# Patient Record
Sex: Male | Born: 1940 | Race: White | Hispanic: No | Marital: Married | Smoking: Former smoker
Health system: Southern US, Community
[De-identification: ages and names within clinical notes are randomized; demographics above are authoritative.]

## PROBLEM LIST (undated history)

## (undated) DIAGNOSIS — I4892 Unspecified atrial flutter: Principal | ICD-10-CM

## (undated) DIAGNOSIS — J449 Chronic obstructive pulmonary disease, unspecified: Secondary | ICD-10-CM

## (undated) DIAGNOSIS — E119 Type 2 diabetes mellitus without complications: Secondary | ICD-10-CM

## (undated) DIAGNOSIS — E669 Obesity, unspecified: Secondary | ICD-10-CM

## (undated) DIAGNOSIS — E7800 Pure hypercholesterolemia, unspecified: Secondary | ICD-10-CM

## (undated) DIAGNOSIS — M3390 Dermatopolymyositis, unspecified, organ involvement unspecified: Secondary | ICD-10-CM

## (undated) DIAGNOSIS — M3313 Other dermatomyositis without myopathy: Secondary | ICD-10-CM

## (undated) DIAGNOSIS — I2510 Atherosclerotic heart disease of native coronary artery without angina pectoris: Secondary | ICD-10-CM

## (undated) HISTORY — DX: Chronic obstructive pulmonary disease, unspecified: J44.9

## (undated) HISTORY — DX: Atherosclerotic heart disease of native coronary artery without angina pectoris: I25.10

## (undated) HISTORY — DX: Pure hypercholesterolemia, unspecified: E78.00

## (undated) HISTORY — DX: Dermatopolymyositis, unspecified, organ involvement unspecified: M33.90

## (undated) HISTORY — DX: Obesity, unspecified: E66.9

## (undated) HISTORY — DX: Other dermatomyositis without myopathy: M33.13

## (undated) HISTORY — PX: OTHER SURGICAL HISTORY: SHX169

## (undated) HISTORY — DX: Unspecified atrial flutter: I48.92

---

## 2003-08-13 HISTORY — PX: PLANTAR FASCIECTOMY: SUR600

## 2004-10-10 ENCOUNTER — Ambulatory Visit: Payer: Self-pay | Admitting: Cardiology

## 2004-10-17 ENCOUNTER — Ambulatory Visit: Payer: Self-pay | Admitting: Cardiology

## 2004-10-17 ENCOUNTER — Ambulatory Visit (HOSPITAL_COMMUNITY): Admission: RE | Admit: 2004-10-17 | Discharge: 2004-10-18 | Payer: Self-pay | Admitting: Orthopedic Surgery

## 2004-10-30 ENCOUNTER — Ambulatory Visit: Payer: Self-pay | Admitting: Internal Medicine

## 2004-11-09 ENCOUNTER — Ambulatory Visit: Payer: Self-pay

## 2005-01-08 ENCOUNTER — Ambulatory Visit: Payer: Self-pay | Admitting: Cardiology

## 2005-07-11 ENCOUNTER — Ambulatory Visit: Payer: Self-pay | Admitting: Cardiology

## 2005-07-29 ENCOUNTER — Ambulatory Visit: Payer: Self-pay | Admitting: Gastroenterology

## 2005-12-10 ENCOUNTER — Ambulatory Visit: Payer: Self-pay | Admitting: Cardiology

## 2006-02-17 ENCOUNTER — Ambulatory Visit: Payer: Self-pay | Admitting: Family Medicine

## 2007-01-19 DIAGNOSIS — C4491 Basal cell carcinoma of skin, unspecified: Secondary | ICD-10-CM

## 2007-01-19 DIAGNOSIS — L570 Actinic keratosis: Secondary | ICD-10-CM

## 2007-01-19 HISTORY — DX: Basal cell carcinoma of skin, unspecified: C44.91

## 2007-01-19 HISTORY — DX: Actinic keratosis: L57.0

## 2007-03-26 ENCOUNTER — Ambulatory Visit: Payer: Self-pay | Admitting: Cardiology

## 2007-04-07 ENCOUNTER — Encounter: Payer: Self-pay | Admitting: Cardiology

## 2007-04-07 ENCOUNTER — Ambulatory Visit: Payer: Self-pay | Admitting: Cardiology

## 2007-12-24 ENCOUNTER — Ambulatory Visit: Payer: Self-pay | Admitting: Cardiology

## 2008-09-07 ENCOUNTER — Ambulatory Visit: Payer: Self-pay | Admitting: Cardiology

## 2008-09-19 DIAGNOSIS — C4492 Squamous cell carcinoma of skin, unspecified: Secondary | ICD-10-CM

## 2008-09-19 HISTORY — DX: Squamous cell carcinoma of skin, unspecified: C44.92

## 2009-07-31 DIAGNOSIS — E7800 Pure hypercholesterolemia, unspecified: Secondary | ICD-10-CM

## 2009-07-31 DIAGNOSIS — E119 Type 2 diabetes mellitus without complications: Secondary | ICD-10-CM | POA: Insufficient documentation

## 2009-07-31 DIAGNOSIS — I2510 Atherosclerotic heart disease of native coronary artery without angina pectoris: Secondary | ICD-10-CM

## 2009-07-31 DIAGNOSIS — J449 Chronic obstructive pulmonary disease, unspecified: Secondary | ICD-10-CM

## 2009-08-09 ENCOUNTER — Ambulatory Visit: Payer: Self-pay | Admitting: Cardiology

## 2009-08-14 ENCOUNTER — Ambulatory Visit: Payer: Self-pay | Admitting: Cardiology

## 2009-08-18 LAB — CONVERTED CEMR LAB
ALT: 21 units/L (ref 0–53)
AST: 21 units/L (ref 0–37)
Albumin: 3.7 g/dL (ref 3.5–5.2)
Alkaline Phosphatase: 30 units/L — ABNORMAL LOW (ref 39–117)
HDL: 50.9 mg/dL (ref >39.00)
Total CHOL/HDL Ratio: 3
Triglycerides: 140 mg/dL (ref 0.0–149.0)

## 2010-08-31 ENCOUNTER — Ambulatory Visit
Admission: RE | Admit: 2010-08-31 | Discharge: 2010-08-31 | Payer: Self-pay | Source: Home / Self Care | Attending: Cardiology | Admitting: Cardiology

## 2010-08-31 ENCOUNTER — Encounter: Payer: Self-pay | Admitting: Cardiology

## 2010-09-13 NOTE — Assessment & Plan Note (Signed)
Summary: [facility]   Visit Type:  1 year follow up Primary Provider:  Randa Lynn  CC:  No complaints.  History of Present Illness: Mr Jackson Parks is seen in followup.  We had a long discussion today regarding his DAPT.  I reviewed all of the elements with him, and he felt more comfortable, despite all of the discussion, staying on DAPT.  He is getting along well.  Denies any major complaints.   Problems Prior to Update: 1)  Cad  (ICD-414.00) 2)  Hypercholesterolemia  (ICD-272.0) 3)  Dm  (ICD-250.00) 4)  Obesity, Morbid  (ICD-278.01) 5)  COPD  (ICD-496)  Current Medications (verified): 1)  Plavix 75 Mg Tabs (Clopidogrel Bisulfate) .... Take One Tablet By Mouth Daily 2)  Janumet 50-1000 Mg Tabs (Sitagliptin-Metformin Hcl) .... Take 1 Tablet By Mouth Two Times A Day 3)  Fish Oil 1200 Mg Caps (Omega-3 Fatty Acids) .... Take 1 Capsule By Mouth Two Times A Day 4)  Cinnamon 500 Mg Caps (Cinnamon) .... Take 1 Capsule By Mouth Once A Day 5)  Aspirin 81 Mg Tbec (Aspirin) .... Take One Tablet By Mouth Daily 6)  Lisinopril 20 Mg Tabs (Lisinopril) .... Take One Tablet By Mouth Daily 7)  Lovastatin 40 Mg Tabs (Lovastatin) .... Take One Tablet By Mouth Daily At Bedtime 8)  Metoprolol Tartrate 25 Mg Tabs (Metoprolol Tartrate) .... Take One Tablet By Mouth Daily 9)  Ibuprofen 800 Mg Tabs (Ibuprofen) .... As Needed 10)  Glucosamine 1500 Complex  Caps (Glucosamine-Chondroit-Vit C-Mn) .... Take 1 Capsule By Mouth Once A Day  Allergies (verified): No Known Drug Allergies  Past History:  Past Medical History: Last updated: 12-Aug-2009 Current Problems:  CAD (ICD-414.00) HYPERCHOLESTEROLEMIA (ICD-272.0) DM (ICD-250.00) OBESITY, MORBID (ICD-278.01) COPD (ICD-496)  Past Surgical History: Last updated: 12-Aug-2009  stenting of the  diagonal branch using a 2.5 x 23 Cypher drug-eluting stent followed   by post dilatation.  Right plantar fasciectomy- 2005 Multiple skin lesions removed  Family  History: Last updated: 2009/08/12 Father deceased at age 18 Myocardial infarction Mother deceased age 33 CHF Brothers: Alive 63- CAD- bladder Cancer                 Alive 75- PVD- legs. Sisters: Alive 73 RA, on coumadin  Hx of blood clots x 2  Social History: Last updated: August 12, 2009 Tobacco Use: no. Quit 2002 Drug use: No Alcohol use: 2 margaritas per year Married x 2 - 3 daughters Local truck driver  Vital Signs:  Patient profile:   70 year old male Height:      70 inches Weight:      293.25 pounds BMI:     42.23 Pulse rate:   68 / minute Pulse rhythm:   regular Resp:     18 per minute BP sitting:   112 / 70  (left arm) Cuff size:   large  Vitals Entered By: Vikki Ports (August 31, 2010 12:01 PM)  Physical Exam  General:  Well developed, well nourished, in no acute distress. Head:  normocephalic and atraumatic Eyes:  PERRLA/EOM intact; conjunctiva and lids normal. Lungs:  Clear bilaterally to auscultation and percussion. Heart:  PMI non displaced.  Normal S1 and S2.  No murmur or rub.  Abdomen:  Bowel sounds positive; abdomen soft and non-tender without masses, organomegaly, or hernias noted. No hepatosplenomegaly. Pulses:  pulses normal in all 4 extremities Extremities:  No clubbing or cyanosis. Neurologic:  Alert and oriented x 3.   EKG  Procedure date:  08/31/2010  Findings:      NSR. RBBB.  Impression & Recommendations:  Problem # 1:  CAD (ICD-414.00) stable at present on DAPT.  Wants to continue. His updated medication list for this problem includes:    Plavix 75 Mg Tabs (Clopidogrel bisulfate) .Marland Kitchen... Take one tablet by mouth daily    Aspirin 81 Mg Tbec (Aspirin) .Marland Kitchen... Take one tablet by mouth daily    Lisinopril 20 Mg Tabs (Lisinopril) .Marland Kitchen... Take one tablet by mouth daily    Metoprolol Tartrate 25 Mg Tabs (Metoprolol tartrate) .Marland Kitchen... Take one tablet by mouth daily  Orders: EKG w/ Interpretation (93000)  Problem # 2:  HYPERCHOLESTEROLEMIA  (ICD-272.0) should have lipid and liver. His updated medication list for this problem includes:    Lovastatin 40 Mg Tabs (Lovastatin) .Marland Kitchen... Take one tablet by mouth daily at bedtime  Orders: EKG w/ Interpretation (93000)  Problem # 3:  DM (ICD-250.00) followed by Dr. Randa Lynn. His updated medication list for this problem includes:    Janumet 50-1000 Mg Tabs (Sitagliptin-metformin hcl) .Marland Kitchen... Take 1 tablet by mouth two times a day    Aspirin 81 Mg Tbec (Aspirin) .Marland Kitchen... Take one tablet by mouth daily    Lisinopril 20 Mg Tabs (Lisinopril) .Marland Kitchen... Take one tablet by mouth daily  Patient Instructions: 1)  Your physician recommends that you continue on your current medications as directed. Please refer to the Current Medication list given to you today. 2)  Your physician wants you to follow-up in: 1 YEAR.  You will receive a reminder letter in the mail two months in advance. If you don't receive a letter, please call our office to schedule the follow-up appointment.

## 2010-11-21 ENCOUNTER — Telehealth: Payer: Self-pay | Admitting: Cardiology

## 2010-11-21 NOTE — Telephone Encounter (Signed)
I reviewed this note with Dr Riley Kill and he said the pt was okay to hold Plavix but to remain on ASA for procedure.  I spoke with the pt's wife and made her aware of Dr Rosalyn Charters recommendation.

## 2010-11-21 NOTE — Telephone Encounter (Signed)
I spoke with the pt's wife and the pt is scheduled for an IPP (Urology procedure) at [facility] on 11/26/10. The pt took his last dose of Plavix on Monday night but has continued Aspirin.  They can do procedure while the pt is on ASA but would like to know if it could also be stopped.  The pt's wife said that Dr Riley Kill made it clear at his most recent OV if the pt stopped Plavix he had to remain on ASA.  I will send this message to Dr Riley Kill for further review.

## 2010-11-25 ENCOUNTER — Other Ambulatory Visit: Payer: Self-pay | Admitting: Cardiology

## 2010-12-25 NOTE — Assessment & Plan Note (Signed)
[facility]                            CARDIOLOGY OFFICE NOTE   NAME:Jackson Parks, Jackson Parks                     MRN:          [id number]  DATE:03/26/2007                            DOB:          [birth year]    HISTORY OF PRESENT ILLNESS:  Jackson Parks is in for follow up.  He was  last seen in May 2007.  In general, he is doing extremely well.  He  denies any ongoing chest pain or shortness of breath.  He has had  traumatic improvement of his symptoms from placement of a drug eluting  stent in the diagonal territory.  The patient is diabetic.  He has  remained on Plavix and aspirin, has dual antiplatelet therapy, and has  had virtually no problems with this.  He is followed up with his primary  care doctor for both management of his diabetes as well as management of  his hyperlipidemia.  He has had no major problems.   CURRENT MEDICATIONS:  1. Plavix 75 mg daily.  2. Toprol 25 mg daily.  3. Enteric coated aspirin 81 mg daily.  4. Glucosamine/Janumet 550/1000 daily.  5. Omega-3 fish oil 2400 mg daily.  6. Cinnamon 500 mg daily.  7. Aspirin 81 mg daily.  8. Lovastatin 20 mg q.h.s.  9. Lisinopril.   PHYSICAL EXAMINATION:  VITAL SIGNS:  Weight 303 pounds, blood pressure  114/62, pulse 80.  LUNGS:  Fields are clear.  There is a soft systolic ejection murmur with  question early diastolic blow, although, this is very, very soft.  It  was only a 1-2/6 soft murmur.  There was no definite extremity edema.   STUDIES:  The patient electrocardiogram demonstrates normal sinus  rhythm.  There is a right bundle branch block.  He had an RV conduction  delay noted on previous EKG.  This is not felt to be significant.   IMPRESSION:  1. Coronary artery disease status post percutaneous stenting of the      diagonal.  2. Hypercholesterolemia on lipid lower management.  3. Diabetes mellitus.   RECOMMENDATIONS:  1. Continue to follow up with Dr. Micah Noel.  2. He and I  discussed the latest data on dual antiplatelet therapy and      discussion is going on in the background at the present time.     Arturo Morton. Riley Kill, MD, [facility]  Electronically Signed    TDS/MedQ  DD: 03/26/2007  DT: 03/27/2007  Job #: [id number]   cc:   [facility], [facility] Dr. Lorin Picket

## 2010-12-25 NOTE — Assessment & Plan Note (Signed)
[facility]                            CARDIOLOGY OFFICE NOTE   NAME:Jackson Parks, Jackson Parks                     MRN:          [id number]  DATE:09/07/2008                            DOB:          1941/03/12    Jackson Parks is in for followup.  He had recently seen Dr. Micah Noel and had  a lesion excised from his left arm.  It has been sent for pathology and  the patient is not aware of the results yet.  He is fortunately  continued to lose some weight.  He is down from 315 to 301.  He has been  making conscious effort to do so.  He denies any progressive symptoms.  His wife is worried about him stopping Plavix, specifically as related  to her mother.   MEDICATIONS:  1. Plavix 75 mg daily.  2. Glucosamine daily.  3. Janumet 50/1000 daily.  4. Omega-3 fish oil 2400 mg a day.  5. Cinnamon 500 mg daily.  6. Aspirin 81 mg daily.  7. Lisinopril 20 mg daily.  8. Lovastatin 40 mg daily.  9. Metoprolol tartrate 25 mg b.i.d.   PHYSICAL EXAMINATION:  GENERAL:  He is alert and oriented in no  distress.  VITAL SIGNS:  Weight is 300 pounds, blood pressure 110/62, pulse 75.  LUNG:  Fields are clear.  CARDIAC:  Rhythm is regular.   There is Band-Aid over the left arm.   Electrocardiogram demonstrates normal sinus rhythm with right bundle  branch block.  Compared to previous tracings of May 2009, no significant  interval changes noted.   IMPRESSION:  1. Coronary artery disease status post percutaneous stenting of the      diagonal branch using a 2.5 x 23 Cypher drug-eluting stent followed      by post dilatation.  2. Well-preserved left ventricular function with mild aortic valve      abnormality, not obvious by examinations.  3. Hypercholesterolemia, on lipid lowering therapy.  4. Non-insulin-dependent diabetes mellitus.  5. Morbid obesity with a recent weight loss.   PLAN:  1. Encourage continued weight loss.  2. At the present time, they would like to  continue on dual      antiplatelet therapy.  According to      the guidelines, this could be stopped.  However, given the      uncertainty, the patient would like to continue.     Arturo Morton. Riley Kill, MD, [facility]  Electronically Signed    TDS/MedQ  DD: 09/07/2008  DT: 09/08/2008  Job #: [id number]

## 2010-12-25 NOTE — Assessment & Plan Note (Signed)
[facility]                            CARDIOLOGY OFFICE NOTE   NAME:Jackson Parks                     MRN:          [id number]  DATE:12/24/2007                            DOB:          1941/02/05    [name] is in for a followup visit.  Since I last saw him, he has  been getting along well, but unfortunately he has continued to gain some  weight.  He denies any chest pain.  He underwent a 2-D echocardiogram in  August 2008. His LV function was normal with an estimated ejection  fraction of 55-60%.  There were no definite regional wall motion  abnormalities.  There was mild calcification of aortic valve and mild  annular calcification with mild left atrial dilatation.   CURRENT MEDICATIONS:  1. Plavix 75 mg daily.  2. Toprol 25 mg daily.  3. Glucosamine chondroitin daily.  4. Janumet 500, 1000 mg daily.  5. Omega 3 fish oil 240 mg daily.  6. Cinnamon 500 mg daily.  7. Aspirin 81 mg daily.  8. Lovastatin 20 mg nightly.  9. Lisinopril 20 mg daily.   PHYSICAL EXAMINATION:  GENERAL:  He is alert and oriented.  VITAL SIGNS:  The weight is 315 pounds.  Blood pressure is 128/68. The  pulse is 74.  SKIN:  The patient has clearly been out in the sun and has fair  complexion.  LUNGS:  The lung fields are clear.  CARDIAC:  The cardiac rhythm is regular. There is only a 1/6 systolic  ejection murmur.   IMPRESSION:  1. Coronary artery disease status post percutaneous stenting of the      diagonal branch using a 2.5 x 23 Cypher drug-eluting stent followed      by post dilatation.  2. Well-preserved overall left ventricular function with mild aortic      valve abnormality.  3. Hypercholesterolemia on lipid-lowering therapy.  4. Non-insulin-dependent diabetes mellitus.  5. Morbid obesity.   RECOMMENDATIONS:  1. Return to clinic in 1 year.  2. At the present time, we have had a thorough discussion about the      dual antiplatelet therapy.   Based upon the current national      discussion, we will recommend continued treatment.  We have      discussed the possibility that he could come off since he is out      nearly 3 years now,      but he has tolerated this without difficulty.  3. Continued followup in Dr. Nani Parks office for lipid profile.     Arturo Morton. Riley Kill, MD, [facility]  Electronically Signed    TDS/MedQ  DD: 12/27/2007  DT: 12/27/2007  Job #: [id number]

## 2010-12-28 NOTE — Cardiovascular Report (Signed)
NAME:  Jackson Parks, MANIACI NO.:  [id number]   MEDICAL RECORD NO.:  [id number]          PATIENT TYPE:  OIB   LOCATION:  2899                         FACILITY:  [facility]   PHYSICIAN:  Arturo Morton. Riley Kill, M.D. [facility] OF BIRTH:  03-14-1941   DATE OF PROCEDURE:  10/17/2004  DATE OF DISCHARGE:                              CARDIAC CATHETERIZATION   INDICATIONS:  Mr. Eisenhardt is a delightful 70 year old who has had several  episodes of chest pain.  Myoview revealed an anterior defect.  Because of  this, he was subsequently referred for cardiac catheterization.  The risks,  benefits, and alternatives were discussed with the patient in detail; he was  agreeable to proceed.   PROCEDURE:  1.  Left heart catheterization.  2.  Selective coronary arteriography.  3.  Selective left ventriculography.  4.  Percutaneous transluminal coronary angioplasty and stenting of the left      anterior descending diagonal.   DESCRIPTION OF PROCEDURE:  The patient was brought to the catheterization  laboratory and prepped and draped in the usual fashion.  Through an anterior  puncture, the right femoral artery was easily entered.  A 6-French sheath  was placed and because of the patient's weight, it was difficult to see his  anatomy, but we were able to identify a high-grade subtotal occlusion of the  diagonal branch.  A guiding catheter was subsequently put in to identify  this area better.  The circumflex was a dominant vessel and right coronary  was nondominant.  The diagonal appeared to have a high-grade lesion which  corresponding well to the diagnostic findings at [facility].  With this,  percutaneous intervention was recommended.  We were uncertain whether or not  a 2.5-mm drug-eluting stent would fit.  This was explained to the family  prior to the procedure.  A JL4 guiding catheter was subsequently utilized  with a 7-French sheath after adequate scout views and appropriate  anticoagulation  with heparin and eptifibatide, the lesion was crossed with a  Hi-Torque floppy wire and then dilated with a 2.25 15 Maverick balloon.  Following this, intracoronary nitroglycerin was administered and it appeared  that it would be satisfactory to place a 2.5 x 23 CYPHER stent.  This was  then carefully placed.  This was taken up to 14 atmospheres.  Post-  dilatations were done throughout the balloon using a 2.5 PowerSail also up  to 14 atmospheres.  There is marked improvement the appearance of the artery  with excellent TIMI-3 flow.  The procedure was subsequently completed, all  catheters were subsequently removed and the femoral sheath sewn into place,  and he was taken to the holding area in satisfactory clinical condition.   HEMODYNAMIC DATA.:  1.  Central aortic pressure 150/89, mean 150.  2.  LV 148/20.  3.  No gradient on pullback across aortic valve.   ANGIOGRAPHIC DATA.:  1.  Ventriculography was performed in the RAO projection.  Overall systolic      function was preserved.  2.  The right coronary was a small nondominant vessel that was very tiny in  caliber distally.  3.  The left main was free of critical disease.  4.  The LAD coursed the apex and had some mild luminal irregularity      throughout, but no areas high-grade stenosis in the LAD proper; however,      the large diagonal branch had a 95% stenosis overlapping the origins of      a couple small side branches.  Distal to this, the vessel divided into 2      smaller branches.  Following balloon dilatation, the vessel appeared to      be at least 2.5 and subsequently a 2.5-mm CYPHER 23 drug-eluting stent      was placed in this location.  This resulted in excellent angiographic      appearance without evidence of significant compromise.  Runoff into the      distal vessel was excellent.  5.  There was a small ramus intermedius without critical disease.  6.  The circumflex provides a large marginal branch that has  some ostial      calcification and 30% narrowing, followed by a second 30% narrowing.      There is 30% narrowing in the mid-portion of the AV circumflex which      provides 2 posterolateral segments.   CONCLUSION:  1.  Successful percutaneous stenting of the diagonal branch.  2.  Well-preserved left ventricular function.  3.  Mild scattered disease as noted above.   DISPOSITION:  The patient will need aspirin and Plavix for minimum of 3  months and preferably 1 year or more.  1.  Risk factor reduction is recommended.  We will discuss this with him in      detail.      TDS/MEDQ  D:  10/17/2004  T:  10/18/2004  Job:  [id number]   cc:   [facility]

## 2010-12-28 NOTE — Discharge Summary (Signed)
NAME:  Jackson Parks, GOLDSTON NO.:  [id number]   MEDICAL RECORD NO.:  [id number]          PATIENT TYPE:  OIB   LOCATION:  6526                         FACILITY:  [facility]   PHYSICIAN:  Arturo Morton. Riley Kill, M.D. [facility] OF BIRTH:  [birth year]   DATE OF ADMISSION:  10/17/2004  DATE OF DISCHARGE:  10/18/2004                                 DISCHARGE SUMMARY   DISCHARGE DIAGNOSIS:  Status post cardiac catheterization with successful  percutaneous stenting of diagonal branch using a Cypher stent.   PAST MEDICAL HISTORY:  1.  Obesity.  2.  History of tobacco abuse, currently discontinued.  3.  COPD.  4.  Hyperlipidemia.  5.  Hyperglycemia.   The patient discharged home with Plavix times one year. Discharging  medications include Plavix 75 mg daily, aspirin 325 mg daily, Toprol-XL 25  mg daily, and Mevacor 40 mg daily.   PAIN MANAGEMENT:  Tylenol for general discomfort, nitroglycerin for chest  discomfort.   ACTIVITY:  No driving times two days, no lifting over 10 pounds times one  week.   DIET:  He is to follow a low-fat, low-salt diet.   WOUND CARE:  Gently clean cath site with soap and water. No tub bathing for  one to two weeks.   He is to call our office for any fever, pain or swelling from the cath site.  I have scheduled him for a follow-up appointment with Dr. Rosalyn Charters PA on  October 31, 2003, at 9:15 a.m.   At discharge, Dr. Riley Kill in to see patient. Labwork stable. BUN 9,  creatinine 0.9, glucose elevated at 168, hemoglobin 14.7, hematocrit 42.8.  Patient afebrile. Blood pressure stable. Saturation 94% on room air. He will  follow up with Dr. Riley Kill as scheduled and follow up with Dr. Micah Noel for  primary care issues. In the future if his hyperglycemia continues he will  need interventions with diet, exercise, or oral hypoglycemic medications.      MB/MEDQ  D:  10/18/2004  T:  10/18/2004  Job:  [id number]   cc:   Dr. Darrel Reach, [place]

## 2011-03-27 ENCOUNTER — Other Ambulatory Visit: Payer: Self-pay | Admitting: Cardiology

## 2011-09-26 ENCOUNTER — Other Ambulatory Visit: Payer: Self-pay | Admitting: Cardiology

## 2011-10-25 ENCOUNTER — Encounter: Payer: Self-pay | Admitting: *Deleted

## 2011-11-07 ENCOUNTER — Ambulatory Visit (INDEPENDENT_AMBULATORY_CARE_PROVIDER_SITE_OTHER): Payer: Medicare Other | Admitting: Cardiology

## 2011-11-07 ENCOUNTER — Encounter: Payer: Self-pay | Admitting: Cardiology

## 2011-11-07 VITALS — BP 120/78 | HR 70 | Ht 71.0 in | Wt 296.4 lb

## 2011-11-07 DIAGNOSIS — E7800 Pure hypercholesterolemia, unspecified: Secondary | ICD-10-CM

## 2011-11-07 DIAGNOSIS — E119 Type 2 diabetes mellitus without complications: Secondary | ICD-10-CM

## 2011-11-07 DIAGNOSIS — I2510 Atherosclerotic heart disease of native coronary artery without angina pectoris: Secondary | ICD-10-CM

## 2011-11-07 NOTE — Patient Instructions (Signed)
Your physician wants you to follow-up in: 1 YEAR.  You will receive a reminder letter in the mail two months in advance. If you don't receive a letter, please call our office to schedule the follow-up appointment.  Your physician recommends that you continue on your current medications as directed. Please refer to the Current Medication list given to you today.  

## 2011-11-16 NOTE — Progress Notes (Signed)
HPI:  Patient seen in follow up.  He is doing well.  Every year we discuss use of DAPT, and feels more comfortable staying on clopidogrel.  He has a first gen stent in the diagonal position with a stable indication, and he is not having any angina.  No rest symptoms.  Does not use ibuprofen regularly.    Current Outpatient Prescriptions  Medication Sig Dispense Refill  . aspirin EC 81 MG tablet Take 81 mg by mouth daily.      . Cinnamon 500 MG capsule Take 500 mg by mouth daily.      . fish oil-omega-3 fatty acids 1000 MG capsule Take 2 g by mouth daily.      . Glucosamine-Chondroit-Vit C-Mn (GLUCOSAMINE 1500 COMPLEX) CAPS Take by mouth daily.      Marland Kitchen ibuprofen (ADVIL,MOTRIN) 800 MG tablet Take 800 mg by mouth as needed.      Marland Kitchen lisinopril (PRINIVIL,ZESTRIL) 20 MG tablet Take 20 mg by mouth daily.      Marland Kitchen lovastatin (MEVACOR) 40 MG tablet take 1 tablet by mouth at bedtime  30 tablet  1  . metoprolol tartrate (LOPRESSOR) 25 MG tablet Take 25 mg by mouth daily.      Marland Kitchen PLAVIX 75 MG tablet take 1 tablet by mouth once daily  30 tablet  1  . sitaGLIPtan-metformin (JANUMET) 50-1000 MG per tablet Take 1 tablet by mouth 2 (two) times daily with a meal.        No Known Allergies  Past Medical History  Diagnosis Date  . CAD (coronary artery disease)   . Hypercholesterolemia   . DM (dermatomyositis)   . Obesity   . COPD (chronic obstructive pulmonary disease)     Past Surgical History  Procedure Date  . Plantar fasciectomy 2005  . Skin lesions     multiple removed  . Steting     stenting of the diagonal branch using a 2.5 x 23 Cypher drug-eluting stent followed by post dilatation    Family History  Problem Relation Age of Onset  . Heart attack Father 90    deceased  . Heart failure Mother 53    deceased  . Coronary artery disease Brother 58  . Peripheral vascular disease Brother 46    History   Social History  . Marital Status: Married    Spouse Name: N/A    Number of  Children: 2  . Years of Education: N/A   Occupational History  . truck driver    Social History Main Topics  . Smoking status: Former Smoker    Quit date: 08/12/2000  . Smokeless tobacco: Not on file  . Alcohol Use: Yes     two margaritas per year  . Drug Use: Not on file  . Sexually Active: Not on file   Other Topics Concern  . Not on file   Social History Narrative  . No narrative on file    ROS: Please see the HPI.  All other systems reviewed and negative.  PHYSICAL EXAM:  BP 120/78  Pulse 70  Ht 5\' 11"  (1.803 m)  Wt 296 lb 6.4 oz (134.446 kg)  BMI 41.34 kg/m2  General: Well developed, obese, in no acute distress. Head:  Normocephalic and atraumatic. Neck: no JVD Lungs: Clear to auscultation and percussion. Heart: Normal S1 and S2.  No murmur, rubs or gallops.  Abdomen:  Normal bowel sounds; soft; non tender; no organomegaly Pulses: Pulses normal in all 4 extremities. Extremities: No clubbing or  cyanosis. No edema. Neurologic: Alert and oriented x 3.  EKG:  NSR.  RBBB.  No acute changes.    ASSESSMENT AND PLAN:

## 2011-11-29 ENCOUNTER — Other Ambulatory Visit: Payer: Self-pay | Admitting: Cardiology

## 2011-12-12 NOTE — Assessment & Plan Note (Signed)
Monitored by his primary care MD.

## 2011-12-12 NOTE — Assessment & Plan Note (Signed)
Followed by his primary MD.  Leanora Ivanoff lovastatin.  Last LDL measured in our system 99.

## 2011-12-12 NOTE — Assessment & Plan Note (Signed)
Had diagonal stent, and no other critical obstruction. First generation DES.  He prefers to continue DAPT.

## 2012-10-03 ENCOUNTER — Encounter (HOSPITAL_COMMUNITY): Payer: Self-pay | Admitting: Emergency Medicine

## 2012-10-03 ENCOUNTER — Emergency Department (HOSPITAL_COMMUNITY): Payer: Medicare Other

## 2012-10-03 ENCOUNTER — Inpatient Hospital Stay (HOSPITAL_COMMUNITY)
Admission: EM | Admit: 2012-10-03 | Discharge: 2012-10-06 | DRG: 287 | Disposition: A | Discharge status: Home / Self Care | Payer: Medicare Other | Attending: Cardiology | Admitting: Cardiology

## 2012-10-03 DIAGNOSIS — R0789 Other chest pain: Secondary | ICD-10-CM | POA: Diagnosis present

## 2012-10-03 DIAGNOSIS — J4489 Other specified chronic obstructive pulmonary disease: Secondary | ICD-10-CM | POA: Diagnosis present

## 2012-10-03 DIAGNOSIS — E119 Type 2 diabetes mellitus without complications: Secondary | ICD-10-CM | POA: Diagnosis present

## 2012-10-03 DIAGNOSIS — Z7901 Long term (current) use of anticoagulants: Secondary | ICD-10-CM

## 2012-10-03 DIAGNOSIS — Z6841 Body Mass Index (BMI) 40.0 and over, adult: Secondary | ICD-10-CM

## 2012-10-03 DIAGNOSIS — R079 Chest pain, unspecified: Secondary | ICD-10-CM

## 2012-10-03 DIAGNOSIS — I4892 Unspecified atrial flutter: Principal | ICD-10-CM | POA: Diagnosis present

## 2012-10-03 DIAGNOSIS — I2510 Atherosclerotic heart disease of native coronary artery without angina pectoris: Secondary | ICD-10-CM | POA: Diagnosis present

## 2012-10-03 DIAGNOSIS — E7800 Pure hypercholesterolemia, unspecified: Secondary | ICD-10-CM | POA: Diagnosis present

## 2012-10-03 DIAGNOSIS — Z79899 Other long term (current) drug therapy: Secondary | ICD-10-CM

## 2012-10-03 DIAGNOSIS — G4733 Obstructive sleep apnea (adult) (pediatric): Secondary | ICD-10-CM | POA: Diagnosis present

## 2012-10-03 DIAGNOSIS — Z87891 Personal history of nicotine dependence: Secondary | ICD-10-CM

## 2012-10-03 DIAGNOSIS — Z7982 Long term (current) use of aspirin: Secondary | ICD-10-CM

## 2012-10-03 DIAGNOSIS — Z8249 Family history of ischemic heart disease and other diseases of the circulatory system: Secondary | ICD-10-CM

## 2012-10-03 DIAGNOSIS — Z9861 Coronary angioplasty status: Secondary | ICD-10-CM

## 2012-10-03 DIAGNOSIS — E669 Obesity, unspecified: Secondary | ICD-10-CM | POA: Diagnosis present

## 2012-10-03 LAB — CBC
HCT: 40.2 % (ref 39.0–52.0)
Hemoglobin: 14.2 g/dL (ref 13.0–17.0)
MCHC: 35.3 g/dL (ref 30.0–36.0)
RDW: 13.6 % (ref 11.5–15.5)
WBC: 11 10*3/uL — ABNORMAL HIGH (ref 4.0–10.5)

## 2012-10-03 LAB — URINALYSIS, ROUTINE W REFLEX MICROSCOPIC
Glucose, UA: 100 mg/dL — AB
Hgb urine dipstick: NEGATIVE
Leukocytes, UA: NEGATIVE
pH: 5 (ref 5.0–8.0)

## 2012-10-03 LAB — POCT I-STAT TROPONIN I
Troponin i, poc: 0 ng/mL (ref 0.00–0.08)
Troponin i, poc: 0.01 ng/mL (ref 0.00–0.08)

## 2012-10-03 LAB — COMPREHENSIVE METABOLIC PANEL
ALT: 14 U/L (ref 0–53)
Albumin: 3.8 g/dL (ref 3.5–5.2)
Alkaline Phosphatase: 26 U/L — ABNORMAL LOW (ref 39–117)
BUN: 20 mg/dL (ref 6–23)
Chloride: 100 mEq/L (ref 96–112)
Glucose, Bld: 135 mg/dL — ABNORMAL HIGH (ref 70–99)
Potassium: 4.5 mEq/L (ref 3.5–5.1)
Sodium: 135 mEq/L (ref 135–145)
Total Bilirubin: 0.2 mg/dL — ABNORMAL LOW (ref 0.3–1.2)
Total Protein: 7.5 g/dL (ref 6.0–8.3)

## 2012-10-03 LAB — PROTIME-INR: Prothrombin Time: 13.5 seconds (ref 11.6–15.2)

## 2012-10-03 MED ORDER — NITROGLYCERIN 0.4 MG SL SUBL
SUBLINGUAL_TABLET | SUBLINGUAL | Status: AC
  Filled 2012-10-03: qty 25

## 2012-10-03 MED ORDER — IOHEXOL 350 MG/ML SOLN
100.0000 mL | Freq: Once | INTRAVENOUS | Status: AC | PRN
  Administered 2012-10-03: 200 mL via INTRAVENOUS

## 2012-10-03 MED ORDER — ASPIRIN 81 MG PO CHEW
81.0000 mg | CHEWABLE_TABLET | Freq: Once | ORAL | Status: AC
  Administered 2012-10-03: 81 mg via ORAL
  Filled 2012-10-03: qty 1

## 2012-10-03 MED ORDER — METOPROLOL TARTRATE 1 MG/ML IV SOLN
5.0000 mg | Freq: Once | INTRAVENOUS | Status: AC
  Administered 2012-10-03: 5 mg via INTRAVENOUS
  Filled 2012-10-03: qty 5

## 2012-10-03 MED ORDER — METOPROLOL TARTRATE 1 MG/ML IV SOLN
5.0000 mg | Freq: Once | INTRAVENOUS | Status: AC
  Administered 2012-10-03: 5 mg via INTRAVENOUS
  Filled 2012-10-03 (×2): qty 5

## 2012-10-03 MED ORDER — SODIUM CHLORIDE 0.9 % IV SOLN
1000.0000 mL | INTRAVENOUS | Status: DC
  Administered 2012-10-03 – 2012-10-05 (×3): 1000 mL via INTRAVENOUS

## 2012-10-03 NOTE — ED Provider Notes (Signed)
History     CSN: [id number]  Arrival date & time 10/03/12  1813   First MD Initiated Contact with Patient 10/03/12 1831      Chief Complaint  Patient presents with  . Chest Pain    (Consider location/radiation/quality/duration/timing/severity/associated sxs/prior treatment) HPI Comments: Patient tells a rambling story, about how to and better over the rosebud peanuts, steak and potatoes last night. He had helped a friend Midwife a hog this morning, and afterwards ate two sausage patties after that. Then he had gone home and had salsa and chips at lunch time. After that he has noted what off of trailer. He developed chest pain in the anterior chest that went up into his neck. He rested and it went away. He went inside and took a shower, and the pain came back and he had to sit down and rest again. He took 3 baby aspirin was, and decided to come to the hospital. He has a history of coronary artery disease and had a stent placed by Dr. Riley Kill in 2006. He had been on Plavix until he ran out of it about 3 weeks ago.  Patient is a 72 y.o. male presenting with chest pain. The history is provided by the patient and medical records. No language interpreter was used.  Chest Pain Pain location:  Substernal area Pain quality: aching   Pain radiates to:  L jaw Pain radiates to the back: no (He is not currently in pain.)   Pain severity:  No pain Onset quality:  Sudden Duration:  10 minutes Timing:  Intermittent Progression since onset: He has had 2 episodes of the chest pain. Chronicity:  New Context comment:  Exertion. Relieved by:  Rest Worsened by:  Nothing tried Associated symptoms: no dizziness   Risk factors: coronary artery disease, diabetes mellitus, hypertension and male sex   Risk factors comment:  Known coronary artery disease with prior stenting.   Past Medical History  Diagnosis Date  . CAD (coronary artery disease)   . Hypercholesterolemia   . DM (dermatomyositis)   .  Obesity   . COPD (chronic obstructive pulmonary disease)     Past Surgical History  Procedure Laterality Date  . Plantar fasciectomy  2005  . Skin lesions      multiple removed  . Steting      stenting of the diagonal branch using a 2.5 x 23 Cypher drug-eluting stent followed by post dilatation    Family History  Problem Relation Age of Onset  . Heart attack Father 15    deceased  . Heart failure Mother 5    deceased  . Coronary artery disease Brother 73  . Peripheral vascular disease Brother 53    History  Substance Use Topics  . Smoking status: Former Smoker    Quit date: 12/23/2000  . Smokeless tobacco: Not on file  . Alcohol Use: Yes     Comment: two margaritas per year      Review of Systems  Constitutional: Negative.   HENT: Negative.   Eyes: Negative.   Respiratory: Negative.   Cardiovascular: Positive for chest pain.  Gastrointestinal: Negative.   Endocrine:       Known diabetic.  Genitourinary: Negative.   Musculoskeletal: Negative.   Skin: Negative.   Neurological: Negative.  Negative for dizziness.  Psychiatric/Behavioral: Negative.     Allergies  Review of patient's allergies indicates no known allergies.  Home Medications   Current Outpatient Rx  Name  Route  Sig  Dispense  Refill  . aspirin EC 81 MG tablet   Oral   Take 81 mg by mouth daily.         . Cinnamon 500 MG capsule   Oral   Take 500 mg by mouth daily.         . fish oil-omega-3 fatty acids 1000 MG capsule   Oral   Take 2 g by mouth daily.         . Glucosamine-Chondroit-Vit C-Mn (GLUCOSAMINE 1500 COMPLEX) CAPS   Oral   Take by mouth daily.         Marland Kitchen ibuprofen (ADVIL,MOTRIN) 800 MG tablet   Oral   Take 800 mg by mouth as needed.         Marland Kitchen lisinopril (PRINIVIL,ZESTRIL) 20 MG tablet   Oral   Take 20 mg by mouth daily.         Marland Kitchen lovastatin (MEVACOR) 40 MG tablet      take 1 tablet by mouth at bedtime   30 tablet   5   . metoprolol tartrate  (LOPRESSOR) 25 MG tablet   Oral   Take 25 mg by mouth daily.         Marland Kitchen PLAVIX 75 MG tablet      take 1 tablet by mouth once daily   30 tablet   1   . sitaGLIPtan-metformin (JANUMET) 50-1000 MG per tablet   Oral   Take 1 tablet by mouth 2 (two) times daily with a meal.           BP 117/80  Pulse 116  Temp(Src) 97.9 F (36.6 C) (Oral)  Resp 16  SpO2 94%  Physical Exam  Nursing note and vitals reviewed. Constitutional: He is oriented to person, place, and time.  He has a resting tachycardia of about 120. Obese elderly man in no distress.  HENT:  Head: Normocephalic and atraumatic.  Right Ear: External ear normal.  Left Ear: External ear normal.  Mouth/Throat: Oropharynx is clear and moist.  Eyes: Conjunctivae and EOM are normal. Pupils are equal, round, and reactive to light.  Neck: Normal range of motion.  Cardiovascular: Normal rate, regular rhythm and normal heart sounds.   Pulmonary/Chest: Effort normal.  Abdominal: Soft. Bowel sounds are normal.  Musculoskeletal: Normal range of motion.  Neurological: He is alert and oriented to person, place, and time.  No sensory or motor deficit.  Skin: Skin is warm and dry.  Psychiatric: He has a normal mood and affect. His behavior is normal.    ED Course  Procedures (including critical care time)  6:32 PM  Date: 10/03/2012  Rate: 123  Rhythm: sinus tachycardia  QRS Axis: left  Intervals: QT prolonged  ST/T Wave abnormalities: normal  Conduction Disutrbances:right bundle branch block  Narrative Interpretation: Abnormal EKG  Old EKG Reviewed: changes noted--rate more rapid than on tracind o 11/07/2011.  7:13 PM Pt seen --> physical exam performed.  EKG showed a sinus tachycardia, so metoprolol 5 mg IV was ordered.  Lab workup was ordered.  8:58 PM Results for orders placed during the hospital encounter of 10/03/12  CBC      Result Value Range   WBC 11.0 (*) 4.0 - 10.5 K/uL   RBC 4.42  4.22 - 5.81 MIL/uL    Hemoglobin 14.2  13.0 - 17.0 g/dL   HCT 91.4  78.2 - 95.6 %   MCV 91.0  78.0 - 100.0 fL   MCH 32.1  26.0 - 34.0 pg   MCHC  35.3  30.0 - 36.0 g/dL   RDW 14.7  82.9 - 56.2 %   Platelets 222  150 - 400 K/uL  COMPREHENSIVE METABOLIC PANEL      Result Value Range   Sodium 135  135 - 145 mEq/L   Potassium 4.5  3.5 - 5.1 mEq/L   Chloride 100  96 - 112 mEq/L   CO2 24  19 - 32 mEq/L   Glucose, Bld 135 (*) 70 - 99 mg/dL   BUN 20  6 - 23 mg/dL   Creatinine, Ser 1.30  0.50 - 1.35 mg/dL   Calcium 9.8  8.4 - 86.5 mg/dL   Total Protein 7.5  6.0 - 8.3 g/dL   Albumin 3.8  3.5 - 5.2 g/dL   AST 12  0 - 37 U/L   ALT 14  0 - 53 U/L   Alkaline Phosphatase 26 (*) 39 - 117 U/L   Total Bilirubin 0.2 (*) 0.3 - 1.2 mg/dL   GFR calc non Af Amer 84 (*) >90 mL/min   GFR calc Af Amer >90  >90 mL/min  PROTIME-INR      Result Value Range   Prothrombin Time 13.5  11.6 - 15.2 seconds   INR 1.04  0.00 - 1.49  APTT      Result Value Range   aPTT 26  24 - 37 seconds  POCT I-STAT TROPONIN I      Result Value Range   Troponin i, poc 0.00  0.00 - 0.08 ng/mL   Comment 3            Dg Chest Portable 1 View  10/03/2012  *RADIOLOGY REPORT*  Clinical Data: Chest pain.  PORTABLE CHEST - 1 VIEW  Comparison: No priors.  Findings: Lung volumes are normal.  No consolidative airspace disease.  No pleural effusions.  No pneumothorax.  No evidence of pulmonary edema.  Borderline cardiomegaly.  Upper mediastinal contours are within normal limits.  Atherosclerosis of the thoracic aorta.  IMPRESSION: 1.  Borderline cardiomegaly. 2.  Atherosclerosis.   Original Report Authenticated By: Trudie Reed, M.D.     Pt's lab workup is negative so far.  His rate is down to 110-115.  He is not in pain.  Will repeat IV metoprolol.  Call to [facility] to admit him for chest pain observation.   9:09 PM Case discussed with Cardiology Fellow, who requests CT angio of chest to check for pulmonary embolism.  12:10 AM CT Angio of  chest was negative for PE.    1. Chest pain            Carleene Cooper III, MD 10/04/12 [phone]

## 2012-10-03 NOTE — ED Notes (Signed)
Pt states "I had a discomfort, like a burning, and I burped and some acid came up. I never had any pain. I can walk around and do anything, it doesn't make it worse." Denies sob, n/v, diaphoresis, or lightheadedness. Pt states "I feel fine right now. Nothing in my chest."

## 2012-10-03 NOTE — ED Notes (Signed)
Pt reports central chest pain onset today after doing some heaving lifting and eating different things today. Pt denies history of heartburn. Pt also reports ate out last night at restaurant and had a lot of peanuts. Pt denies any other symptoms. Pain subsided with rest, but began again after taking a shower.

## 2012-10-04 ENCOUNTER — Encounter (HOSPITAL_COMMUNITY): Payer: Self-pay | Admitting: *Deleted

## 2012-10-04 LAB — TROPONIN I
Troponin I: <0.3 ng/mL (ref <0.30)
Troponin I: <0.3 ng/mL (ref <0.30)
Troponin I: <0.3 ng/mL (ref <0.30)

## 2012-10-04 LAB — LIPID PANEL
Cholesterol: 190 mg/dL (ref 0–200)
LDL Cholesterol: 103 mg/dL — ABNORMAL HIGH (ref 0–99)
VLDL: 36 mg/dL (ref 0–40)

## 2012-10-04 LAB — CBC WITH DIFFERENTIAL/PLATELET
Basophils Relative: 0 % (ref 0–1)
Eosinophils Absolute: 0.2 10*3/uL (ref 0.0–0.7)
Lymphs Abs: 3.1 10*3/uL (ref 0.7–4.0)
MCH: 32.5 pg (ref 26.0–34.0)
Neutrophils Relative %: 58 % (ref 43–77)
Platelets: 213 10*3/uL (ref 150–400)
RBC: 4.43 MIL/uL (ref 4.22–5.81)

## 2012-10-04 LAB — GLUCOSE, CAPILLARY: Glucose-Capillary: 117 mg/dL — ABNORMAL HIGH (ref 70–99)

## 2012-10-04 LAB — BASIC METABOLIC PANEL
Chloride: 101 mEq/L (ref 96–112)
GFR calc Af Amer: >90 mL/min (ref >90)
Potassium: 4.2 mEq/L (ref 3.5–5.1)

## 2012-10-04 LAB — POCT I-STAT TROPONIN I: Troponin i, poc: 0.02 ng/mL (ref 0.00–0.08)

## 2012-10-04 LAB — HEPARIN LEVEL (UNFRACTIONATED): Heparin Unfractionated: 0.13 IU/mL — ABNORMAL LOW (ref 0.30–0.70)

## 2012-10-04 LAB — PRO B NATRIURETIC PEPTIDE: Pro B Natriuretic peptide (BNP): 348 pg/mL — ABNORMAL HIGH (ref 0–125)

## 2012-10-04 LAB — CREATININE, SERUM
Creatinine, Ser: 0.84 mg/dL (ref 0.50–1.35)
GFR calc Af Amer: >90 mL/min (ref >90)

## 2012-10-04 LAB — TSH: TSH: 1.748 u[IU]/mL (ref 0.350–4.500)

## 2012-10-04 MED ORDER — CLOPIDOGREL BISULFATE 75 MG PO TABS
75.0000 mg | ORAL_TABLET | Freq: Once | ORAL | Status: AC
  Administered 2012-10-04: 75 mg via ORAL
  Filled 2012-10-04: qty 1

## 2012-10-04 MED ORDER — ASPIRIN 81 MG PO CHEW
324.0000 mg | CHEWABLE_TABLET | ORAL | Status: AC
  Administered 2012-10-05: 324 mg via ORAL
  Filled 2012-10-04: qty 4

## 2012-10-04 MED ORDER — ENOXAPARIN SODIUM 40 MG/0.4ML ~~LOC~~ SOLN
40.0000 mg | SUBCUTANEOUS | Status: DC
  Filled 2012-10-04: qty 0.4

## 2012-10-04 MED ORDER — HEPARIN (PORCINE) IN NACL 100-0.45 UNIT/ML-% IJ SOLN
1750.0000 [IU]/h | INTRAMUSCULAR | Status: DC
  Administered 2012-10-04 (×2): 1750 [IU]/h via INTRAVENOUS
  Filled 2012-10-04 (×3): qty 250

## 2012-10-04 MED ORDER — LINAGLIPTIN 5 MG PO TABS
5.0000 mg | ORAL_TABLET | Freq: Every day | ORAL | Status: DC
  Administered 2012-10-04 – 2012-10-06 (×3): 5 mg via ORAL
  Filled 2012-10-04 (×3): qty 1

## 2012-10-04 MED ORDER — SODIUM CHLORIDE 0.9 % IV SOLN: 250.0000 mL | INTRAVENOUS | Status: DC | PRN

## 2012-10-04 MED ORDER — METOPROLOL TARTRATE 25 MG PO TABS
25.0000 mg | ORAL_TABLET | Freq: Two times a day (BID) | ORAL | Status: DC
  Administered 2012-10-04 (×2): 25 mg via ORAL
  Filled 2012-10-04 (×4): qty 1

## 2012-10-04 MED ORDER — ONDANSETRON HCL 4 MG/2ML IJ SOLN: 4.0000 mg | Freq: Four times a day (QID) | INTRAMUSCULAR | Status: DC | PRN

## 2012-10-04 MED ORDER — OMEGA-3-ACID ETHYL ESTERS 1 G PO CAPS
2.0000 g | ORAL_CAPSULE | Freq: Two times a day (BID) | ORAL | Status: DC
  Administered 2012-10-04 – 2012-10-06 (×5): 2 g via ORAL
  Filled 2012-10-04 (×7): qty 2

## 2012-10-04 MED ORDER — HEPARIN (PORCINE) IN NACL 100-0.45 UNIT/ML-% IJ SOLN
1450.0000 [IU]/h | INTRAMUSCULAR | Status: DC
  Administered 2012-10-04: 1450 [IU]/h via INTRAVENOUS
  Filled 2012-10-04 (×2): qty 250

## 2012-10-04 MED ORDER — NITROGLYCERIN 0.4 MG SL SUBL: 0.4000 mg | SUBLINGUAL_TABLET | SUBLINGUAL | Status: DC | PRN

## 2012-10-04 MED ORDER — METOPROLOL TARTRATE 25 MG PO TABS: 25.0000 mg | ORAL_TABLET | Freq: Every evening | ORAL | Status: DC

## 2012-10-04 MED ORDER — METFORMIN HCL 500 MG PO TABS: 1000.0000 mg | ORAL_TABLET | Freq: Two times a day (BID) | ORAL | Status: DC

## 2012-10-04 MED ORDER — SIMVASTATIN 20 MG PO TABS
20.0000 mg | ORAL_TABLET | Freq: Every day | ORAL | Status: DC
  Administered 2012-10-04 – 2012-10-05 (×2): 20 mg via ORAL
  Filled 2012-10-04 (×3): qty 1

## 2012-10-04 MED ORDER — ACETAMINOPHEN 325 MG PO TABS: 650.0000 mg | ORAL_TABLET | ORAL | Status: DC | PRN

## 2012-10-04 MED ORDER — LISINOPRIL 20 MG PO TABS
20.0000 mg | ORAL_TABLET | Freq: Every evening | ORAL | Status: DC
  Administered 2012-10-04 – 2012-10-05 (×2): 20 mg via ORAL
  Filled 2012-10-04 (×2): qty 1

## 2012-10-04 MED ORDER — METOPROLOL TARTRATE 25 MG PO TABS
25.0000 mg | ORAL_TABLET | Freq: Every evening | ORAL | Status: DC
  Filled 2012-10-04: qty 1

## 2012-10-04 MED ORDER — INSULIN ASPART 100 UNIT/ML ~~LOC~~ SOLN
0.0000 [IU] | Freq: Three times a day (TID) | SUBCUTANEOUS | Status: DC
  Administered 2012-10-04: 2 [IU] via SUBCUTANEOUS
  Administered 2012-10-05: 3 [IU] via SUBCUTANEOUS
  Administered 2012-10-05 – 2012-10-06 (×3): 2 [IU] via SUBCUTANEOUS

## 2012-10-04 MED ORDER — INSULIN ASPART 100 UNIT/ML ~~LOC~~ SOLN: 0.0000 [IU] | Freq: Every day | SUBCUTANEOUS | Status: DC

## 2012-10-04 MED ORDER — ZOLPIDEM TARTRATE 5 MG PO TABS
5.0000 mg | ORAL_TABLET | Freq: Every evening | ORAL | Status: DC | PRN
  Administered 2012-10-04: 5 mg via ORAL
  Filled 2012-10-04: qty 1

## 2012-10-04 MED ORDER — SODIUM CHLORIDE 0.9 % IJ SOLN: 3.0000 mL | Freq: Two times a day (BID) | INTRAMUSCULAR | Status: DC

## 2012-10-04 MED ORDER — SITAGLIPTIN PHOS-METFORMIN HCL 50-1000 MG PO TABS: 1.0000 | ORAL_TABLET | Freq: Two times a day (BID) | ORAL | Status: DC

## 2012-10-04 MED ORDER — HEPARIN BOLUS VIA INFUSION
3000.0000 [IU] | Freq: Once | INTRAVENOUS | Status: AC
  Administered 2012-10-04: 3000 [IU] via INTRAVENOUS
  Filled 2012-10-04: qty 3000

## 2012-10-04 MED ORDER — ISOSORBIDE MONONITRATE ER 30 MG PO TB24
30.0000 mg | ORAL_TABLET | Freq: Every day | ORAL | Status: DC
  Administered 2012-10-04 – 2012-10-06 (×3): 30 mg via ORAL
  Filled 2012-10-04 (×3): qty 1

## 2012-10-04 MED ORDER — OMEGA-3 FATTY ACIDS 1000 MG PO CAPS: 2.0000 g | ORAL_CAPSULE | Freq: Two times a day (BID) | ORAL | Status: DC

## 2012-10-04 MED ORDER — HEPARIN BOLUS VIA INFUSION
4000.0000 [IU] | Freq: Once | INTRAVENOUS | Status: AC
  Administered 2012-10-04: 4000 [IU] via INTRAVENOUS
  Filled 2012-10-04: qty 4000

## 2012-10-04 MED ORDER — SODIUM CHLORIDE 0.9 % IJ SOLN: 3.0000 mL | INTRAMUSCULAR | Status: DC | PRN

## 2012-10-04 MED ORDER — ASPIRIN EC 81 MG PO TBEC
81.0000 mg | DELAYED_RELEASE_TABLET | Freq: Every day | ORAL | Status: DC
  Administered 2012-10-04: 81 mg via ORAL
  Filled 2012-10-04 (×2): qty 1

## 2012-10-04 NOTE — ED Notes (Signed)
Wifes name and number-[phone] home [phone] cell Name: Jackson Parks

## 2012-10-04 NOTE — H&P (Signed)
Jackson Parks is an 72 y.o. male.    Primary Cardiologist: Dr. Riley Kill  Chief Complaint: Chest pain  HPI: 72 y/o male with a PMH of CAD presenting for chest pain evaluation.  He previously had a cardiac catheterization performed 10/17/2004 that showed 95% diagonal stenosis that was stented with a CYPHER stent.  There was no other significant coronary artery disease and left ventricular function was preserved.  Since then patient has been stable from the coronary artery disease standpoint.  He last saw Dr. Riley Kill 11/07/2011 and has another yearly appointment next month.  He ran out of his Plavix 3 weeks ago (he has been told that he can stop this in the past).  He now presents with post-prandial chest pain that he described as indigestion feeling that was relieved by rest and belching.  He denies any associated shortness of breath, nausea vomiting or diaphoresis. In the ER, he was tachycardic with heart rate in the 120 bpm range.  He had a CT-PE protocol which was negative for PE. Currently he is asymptomatic.  The patient's wife states that he had the same symptoms prior to his stent placement in 2006.  Past Medical History  Diagnosis Date  . CAD (coronary artery disease)   . Hypercholesterolemia   . DM (dermatomyositis)   . Obesity   . COPD (chronic obstructive pulmonary disease)     Past Surgical History  Procedure Laterality Date  . Plantar fasciectomy  2005  . Skin lesions      multiple removed  . Steting      stenting of the diagonal branch using a 2.5 x 23 Cypher drug-eluting stent followed by post dilatation    Family History  Problem Relation Age of Onset  . Heart attack Father 9    deceased  . Heart failure Mother 57    deceased  . Coronary artery disease Brother 53  . Peripheral vascular disease Brother 38   Social History:  reports that he quit smoking about 11 years ago. He does not have any smokeless tobacco history on file. He reports that  drinks alcohol. He  reports that he does not use illicit drugs.  Allergies: No Known Allergies   Medication: ASA 81 mg q day Fish Oil 2 gram q day Lisinopril 5 mg q day Mevacor 40 mg qhs Toprol XL 25 mg q day Plavix 75 mg q day (was out of Plavix for last 3 weeks) Janumet 50/1000 mg q day    (Not in a hospital admission)  Results for orders placed during the hospital encounter of 10/03/12 (from the past 48 hour(s))  CBC     Status: Abnormal   Collection Time    10/03/12  7:10 PM      Result Value Range   WBC 11.0 (*) 4.0 - 10.5 K/uL   RBC 4.42  4.22 - 5.81 MIL/uL   Hemoglobin 14.2  13.0 - 17.0 g/dL   HCT 16.1  09.6 - 04.5 %   MCV 91.0  78.0 - 100.0 fL   MCH 32.1  26.0 - 34.0 pg   MCHC 35.3  30.0 - 36.0 g/dL   RDW 40.9  81.1 - 91.4 %   Platelets 222  150 - 400 K/uL  COMPREHENSIVE METABOLIC PANEL     Status: Abnormal   Collection Time    10/03/12  7:10 PM      Result Value Range   Sodium 135  135 - 145 mEq/L   Potassium 4.5  3.5 -  5.1 mEq/L   Chloride 100  96 - 112 mEq/L   CO2 24  19 - 32 mEq/L   Glucose, Bld 135 (*) 70 - 99 mg/dL   BUN 20  6 - 23 mg/dL   Creatinine, Ser 1.61  0.50 - 1.35 mg/dL   Calcium 9.8  8.4 - 09.6 mg/dL   Total Protein 7.5  6.0 - 8.3 g/dL   Albumin 3.8  3.5 - 5.2 g/dL   AST 12  0 - 37 U/L   ALT 14  0 - 53 U/L   Alkaline Phosphatase 26 (*) 39 - 117 U/L   Total Bilirubin 0.2 (*) 0.3 - 1.2 mg/dL   GFR calc non Af Amer 84 (*) >90 mL/min   GFR calc Af Amer >90  >90 mL/min   Comment:            The eGFR has been calculated     using the CKD EPI equation.     This calculation has not been     validated in all clinical     situations.     eGFR's persistently     <90 mL/min signify     possible Chronic Kidney Disease.  PROTIME-INR     Status: None   Collection Time    10/03/12  7:10 PM      Result Value Range   Prothrombin Time 13.5  11.6 - 15.2 seconds   INR 1.04  0.00 - 1.49  APTT     Status: None   Collection Time    10/03/12  7:10 PM      Result Value  Range   aPTT 26  24 - 37 seconds  POCT I-STAT TROPONIN I     Status: None   Collection Time    10/03/12  7:44 PM      Result Value Range   Troponin i, poc 0.00  0.00 - 0.08 ng/mL   Comment 3            Comment: Due to the release kinetics of cTnI,     a negative result within the first hours     of the onset of symptoms does not rule out     myocardial infarction with certainty.     If myocardial infarction is still suspected,     repeat the test at appropriate intervals.  URINALYSIS, ROUTINE W REFLEX MICROSCOPIC     Status: Abnormal   Collection Time    10/03/12  9:27 PM      Result Value Range   Color, Urine YELLOW  YELLOW   APPearance CLEAR  CLEAR   Specific Gravity, Urine 1.024  1.005 - 1.030   pH 5.0  5.0 - 8.0   Glucose, UA 100 (*) NEGATIVE mg/dL   Hgb urine dipstick NEGATIVE  NEGATIVE   Bilirubin Urine NEGATIVE  NEGATIVE   Ketones, ur NEGATIVE  NEGATIVE mg/dL   Protein, ur NEGATIVE  NEGATIVE mg/dL   Urobilinogen, UA 0.2  0.0 - 1.0 mg/dL   Nitrite NEGATIVE  NEGATIVE   Leukocytes, UA NEGATIVE  NEGATIVE   Comment: MICROSCOPIC NOT DONE ON URINES WITH NEGATIVE PROTEIN, BLOOD, LEUKOCYTES, NITRITE, OR GLUCOSE <1000 mg/dL.  POCT I-STAT TROPONIN I     Status: None   Collection Time    10/03/12 11:12 PM      Result Value Range   Troponin i, poc 0.01  0.00 - 0.08 ng/mL   Comment 3  Comment: Due to the release kinetics of cTnI,     a negative result within the first hours     of the onset of symptoms does not rule out     myocardial infarction with certainty.     If myocardial infarction is still suspected,     repeat the test at appropriate intervals.   Ct Angio Chest W/cm &/or Wo Cm  10/03/2012  *RADIOLOGY REPORT*  Clinical Data: Chest pain.  Resting tachycardia.  Evaluate for pulmonary embolism.  CT ANGIOGRAPHY CHEST  Technique:  Multidetector CT imaging of the chest using the standard protocol during bolus administration of intravenous contrast. Multiplanar  reconstructed images including MIPs were obtained and reviewed to evaluate the vascular anatomy.  Contrast: OMNIPAQUE IOHEXOL 350 MG/ML SOLN  Comparison: No priors.  Findings:  Mediastinum: The study is limited by extensive patient respiratory motion.  With these limitations in mind there is no central, lobar or proximal segmental sized filling defect to suggest pulmonary embolism.  Distal segmental and subsegmental sized filling defects cannot be entirely excluded secondary to respiratory motion. Moderate cardiomegaly with right ventricular and right atrial dilatation.  The pulmonic trunk is dilated (3.4 cm in diameter), suggestive of pulmonary arterial hypertension. There is atherosclerosis of the thoracic aorta, the great vessels of the mediastinum and the coronary arteries, including calcified atherosclerotic plaque in the left main, left anterior descending and left circumflex coronary arteries. Calcifications of the aortic valve.No pathologically enlarged mediastinal or hilar lymph nodes. Esophagus is unremarkable in appearance.  Lungs/Pleura: No acute consolidative airspace disease.  No pleural effusions.  No definite suspicious appearing pulmonary nodules or masses are identified. No pleural effusions.  Upper Abdomen: Unremarkable.  Musculoskeletal: There are no aggressive appearing lytic or blastic lesions noted in the visualized portions of the skeleton.  IMPRESSION: 1.  Although the study is significantly limited by patient respiratory motion there is no evidence to suggest central, lobar or proximal segmental sized pulmonary embolism. 2.  Dilatation of the pulmonic trunk suggestive of pulmonary arterial hypertension.  There is also dilatation of the right ventricle and right atrium. 3. Atherosclerosis, including left main and two-vessel coronary artery disease. Please note that although the presence of coronary artery calcium documents the presence of coronary artery disease, the severity of this  disease and any potential stenosis cannot be assessed on this non-gated CT examination.  Assessment for potential risk factor modification, dietary therapy or pharmacologic therapy may be warranted, if clinically indicated.   Original Report Authenticated By: Trudie Reed, M.D.    Dg Chest Portable 1 View  10/03/2012  *RADIOLOGY REPORT*  Clinical Data: Chest pain.  PORTABLE CHEST - 1 VIEW  Comparison: No priors.  Findings: Lung volumes are normal.  No consolidative airspace disease.  No pleural effusions.  No pneumothorax.  No evidence of pulmonary edema.  Borderline cardiomegaly.  Upper mediastinal contours are within normal limits.  Atherosclerosis of the thoracic aorta.  IMPRESSION: 1.  Borderline cardiomegaly. 2.  Atherosclerosis.   Original Report Authenticated By: Trudie Reed, M.D.     Review of Systems  Constitutional: Negative for fever, chills, weight loss, malaise/fatigue and diaphoresis.  HENT: Negative for hearing loss, ear pain, nosebleeds, congestion, sore throat, neck pain, tinnitus and ear discharge.   Eyes: Negative for blurred vision, double vision, photophobia, pain, discharge and redness.  Respiratory: Negative for cough, hemoptysis, sputum production, shortness of breath, wheezing and stridor.   Cardiovascular: Positive for chest pain. Negative for palpitations, orthopnea, claudication, leg swelling and PND.  Gastrointestinal: Negative for heartburn, nausea, vomiting, abdominal pain, diarrhea, constipation and blood in stool.  Genitourinary: Negative for dysuria, urgency, frequency, hematuria and flank pain.  Musculoskeletal: Negative for myalgias, back pain and joint pain.  Skin: Negative for itching and rash.  Neurological: Negative for dizziness, tingling, tremors, sensory change, speech change, focal weakness, seizures, weakness and headaches.  Psychiatric/Behavioral: Negative for suicidal ideas and hallucinations.    Blood pressure 119/89, pulse 111, temperature  97.9 F (36.6 C), temperature source Oral, resp. rate 18, height 5\' 11"  (1.803 m), weight 132.904 kg (293 lb), SpO2 97.00%. Physical Exam  Constitutional: He is oriented to person, place, and time. He appears well-developed and well-nourished. No distress.  HENT:  Head: Normocephalic and atraumatic.  Eyes: EOM are normal. Right eye exhibits no discharge. Left eye exhibits no discharge. No scleral icterus.  Neck: No JVD present.  Cardiovascular: Normal rate, regular rhythm and normal heart sounds.  Exam reveals no friction rub.   No murmur heard. Respiratory: No respiratory distress. He has no wheezes. He has no rales. He exhibits no tenderness.  GI: He exhibits no distension. There is no tenderness. There is no rebound.  Musculoskeletal: He exhibits no edema and no tenderness.  Neurological: He is alert and oriented to person, place, and time.  Skin: No rash noted. He is not diaphoretic. No erythema.  Psychiatric: He has a normal mood and affect.     Assessment/Plan  1. Chest pain 2. Coronary artery disease s/p diagonal stent 2006 3. DM 4. Hyperlipidemia  Patient will be admitted to the cardiology service to be observed on telemetry.  He will be ruled out for MI with serial cardiac markers and obtain a TTE to re-evaluate left and right ventricular function in the morning.  His home medication will be re-started.  If he rules in for MI or develops significant LV dysfunction, he will probably need a cardiac catheterization.  Otherwise, we will treat his chest pain with prn SL nitroglycerin, start the patient on Imdur and consider a pharmacologic nuclear stress test.  Caliah Kopke E 10/04/2012, 1:03 AM

## 2012-10-04 NOTE — Progress Notes (Signed)
ANTICOAGULATION CONSULT NOTE - Follow up Consult  Pharmacy Consult for Heparin Indication: aflutter; chest pain - r/o ACS  No Known Allergies  Patient Measurements: Height: 5\' 11"  (180.3 cm) Weight: 290 lb 8 oz (131.77 kg) IBW/kg (Calculated) : 75.3 Heparin Dosing Weight: 105 kg  Vital Signs: Temp: 97.3 F (36.3 C) (02/23 1329) Temp src: Oral (02/23 1329) BP: 100/65 mmHg (02/23 1647) Pulse Rate: 102 (02/23 1329)  Labs:  Recent Labs  10/03/12 1910 10/04/12 0129 10/04/12 0130 10/04/12 0535 10/04/12 1155 10/04/12 1747  HGB 14.2 14.4  --   --   --   --   HCT 40.2 40.2  --   --   --   --   PLT 222 213  --   --   --   --   APTT 26  --   --   --   --   --   LABPROT 13.5  --   --   --   --   --   INR 1.04  --   --   --   --   --   HEPARINUNFRC  --   --   --   --   --  0.13*  CREATININE 0.93 0.84  --  0.86  --   --   TROPONINI  --   --  <0.30 <0.30 <0.30  --     Estimated Creatinine Clearance: 112.3 ml/min (by C-G formula based on Cr of 0.86).   Medical History: Past Medical History  Diagnosis Date  . CAD (coronary artery disease)   . Hypercholesterolemia   . DM (dermatomyositis)   . Obesity   . COPD (chronic obstructive pulmonary disease)     Medications:  Prescriptions prior to admission  Medication Sig Dispense Refill  . aspirin EC 81 MG tablet Take 81 mg by mouth daily.      . Cinnamon 500 MG capsule Take 500 mg by mouth daily.      . fish oil-omega-3 fatty acids 1000 MG capsule Take 1 g by mouth 2 (two) times daily.       Marland Kitchen glimepiride (AMARYL) 2 MG tablet Take 2 mg by mouth daily before breakfast.      . ibuprofen (ADVIL,MOTRIN) 200 MG tablet Take 800 mg by mouth every 8 (eight) hours as needed for pain.      Marland Kitchen lisinopril (PRINIVIL,ZESTRIL) 20 MG tablet Take 20 mg by mouth every evening.       . lovastatin (MEVACOR) 40 MG tablet take 1 tablet by mouth at bedtime  30 tablet  5  . metoprolol tartrate (LOPRESSOR) 25 MG tablet Take 25 mg by mouth every  evening.       Marland Kitchen PLAVIX 75 MG tablet take 1 tablet by mouth once daily  30 tablet  1  . PRESCRIPTION MEDICATION       . simvastatin (ZOCOR) 40 MG tablet Take 40 mg by mouth every evening.      . sitaGLIPtan-metformin (JANUMET) 50-1000 MG per tablet Take 1 tablet by mouth 2 (two) times daily with a meal.      . zolpidem (AMBIEN) 10 MG tablet Take 10 mg by mouth at bedtime as needed for sleep.        Assessment: 6 hour heparin level = 0.13 on IV heparin drip 1450 units/hr in this 72 y.o. Male on heparin for afflutter and r/o ACS. No bleeding noted. Past history of cypher stent 2006. Plavix stopped about 3 wks ago.  Plan  for cath tomorrow a.m.  Goal of Therapy:  Heparin level 0.3-0.7 units/ml Monitor platelets by anticoagulation protocol: Yes   Plan:  Bolus IV heparin 3000 units x1 and increase IV Heparin gtt to 1750  units/hr 6 hr heparin level  Daily heparin level and CBC  Noah Delaine, RPh Clinical Pharmacist Pager: [phone]  10/04/2012,6:42 PM

## 2012-10-04 NOTE — Progress Notes (Signed)
ANTICOAGULATION CONSULT NOTE - Initial Consult  Pharmacy Consult for Heparin Indication: aflutter; chest pain - r/o ACS  No Known Allergies  Patient Measurements: Height: 5\' 11"  (180.3 cm) Weight: 290 lb 8 oz (131.77 kg) IBW/kg (Calculated) : 75.3 Heparin Dosing Weight: 105 kg  Vital Signs: Temp: 98.6 F (37 C) (02/23 0425) Temp src: Oral (02/23 0425) BP: 102/62 mmHg (02/23 1004) Pulse Rate: 111 (02/23 1004)  Labs:  Recent Labs  10/03/12 1910 10/04/12 0129 10/04/12 0130 10/04/12 0535  HGB 14.2 14.4  --   --   HCT 40.2 40.2  --   --   PLT 222 213  --   --   APTT 26  --   --   --   LABPROT 13.5  --   --   --   INR 1.04  --   --   --   CREATININE 0.93 0.84  --  0.86  TROPONINI  --   --  <0.30 <0.30    Estimated Creatinine Clearance: 112.3 ml/min (by C-G formula based on Cr of 0.86).   Medical History: Past Medical History  Diagnosis Date  . CAD (coronary artery disease)   . Hypercholesterolemia   . DM (dermatomyositis)   . Obesity   . COPD (chronic obstructive pulmonary disease)     Medications:  Prescriptions prior to admission  Medication Sig Dispense Refill  . aspirin EC 81 MG tablet Take 81 mg by mouth daily.      . Cinnamon 500 MG capsule Take 500 mg by mouth daily.      . fish oil-omega-3 fatty acids 1000 MG capsule Take 1 g by mouth 2 (two) times daily.       Marland Kitchen ibuprofen (ADVIL,MOTRIN) 200 MG tablet Take 800 mg by mouth every 8 (eight) hours as needed for pain.      Marland Kitchen lisinopril (PRINIVIL,ZESTRIL) 20 MG tablet Take 20 mg by mouth every evening.       . lovastatin (MEVACOR) 40 MG tablet take 1 tablet by mouth at bedtime  30 tablet  5  . metoprolol tartrate (LOPRESSOR) 25 MG tablet Take 25 mg by mouth every evening.       Marland Kitchen PLAVIX 75 MG tablet take 1 tablet by mouth once daily  30 tablet  1  . PRESCRIPTION MEDICATION       . sitaGLIPtan-metformin (JANUMET) 50-1000 MG per tablet Take 1 tablet by mouth 2 (two) times daily with a meal.         Assessment: 72 y.o. male presents with chest pain. Pt with cypher stent 2006. Plavix stopped about 3 wks ago. Now to begin heparin for aflutter and r/o ACS. Plan for cath tomorrow a.m.  Goal of Therapy:  Heparin level 0.3-0.7 units/ml Monitor platelets by anticoagulation protocol: Yes   Plan:  1. D/C lovenox 2. Heparin 4000 unit IV bolus 3. Heparin gtt at 1450 units/hr 4. 6 hr heparin level  5. Daily heparin level and CBC  Christoper Fabian, PharmD, BCPS Clinical pharmacist, pager [phone] 10/04/2012,11:05 AM

## 2012-10-04 NOTE — Progress Notes (Signed)
Patient ID: Jackson Parks, male   DOB: April 08, 1941, 72 y.o.   MRN: [id number]    Subjective:  Denies SSCP, palpitations or Dyspnea   Objective:  Filed Vitals:   10/04/12 0130 10/04/12 0206 10/04/12 0425 10/04/12 0700  BP: 110/76 113/71 88/57 125/81  Pulse: 112 115 108   Temp:  98 F (36.7 C) 98.6 F (37 C)   TempSrc:  Oral Oral   Resp:  20 18   Height:  5\' 11"  (1.803 m)    Weight:  290 lb 8 oz (131.77 kg)    SpO2: 96% 96% 96%     Intake/Output from previous day:  Intake/Output Summary (Last 24 hours) at 10/04/12 7829 Last data filed at 10/04/12 0500  Gross per 24 hour  Intake      0 ml  Output    550 ml  Net   -550 ml    Physical Exam: Affect appropriate Obese white male HEENT: normal Neck supple with no adenopathy JVP normal no bruits no thyromegaly Lungs clear with no wheezing and good diaphragmatic motion Heart:  S1/S2 no murmur, no rub, gallop or click PMI normal Abdomen: benighn, BS positve, no tenderness, no AAA no bruit.  No HSM or HJR Distal pulses intact with no bruits No edema Neuro non-focal Skin warm and dry No muscular weakness   Lab Results: Basic Metabolic Panel:  Recent Labs  56/21/30 1910 10/04/12 0129 10/04/12 0535  NA 135  --  134*  K 4.5  --  4.2  CL 100  --  101  CO2 24  --  23  GLUCOSE 135*  --  137*  BUN 20  --  16  CREATININE 0.93 0.84 0.86  CALCIUM 9.8  --  9.3   Liver Function Tests:  Recent Labs  10/03/12 1910  AST 12  ALT 14  ALKPHOS 26*  BILITOT 0.2*  PROT 7.5  ALBUMIN 3.8   CBC:  Recent Labs  10/03/12 1910 10/04/12 0129  WBC 11.0* 10.3  NEUTROABS  --  6.0  HGB 14.2 14.4  HCT 40.2 40.2  MCV 91.0 90.7  PLT 222 213   Cardiac Enzymes:  Recent Labs  10/04/12 0130 10/04/12 0535  TROPONINI <0.30 <0.30   Fasting Lipid Panel:  Recent Labs  10/04/12 0535  CHOL 190  HDL 51  LDLCALC 103*  TRIG 180*  CHOLHDL 3.7    Imaging: Ct Angio Chest W/cm &/or Wo Cm  10/03/2012  *RADIOLOGY REPORT*   Clinical Data: Chest pain.  Resting tachycardia.  Evaluate for pulmonary embolism.  CT ANGIOGRAPHY CHEST  Technique:  Multidetector CT imaging of the chest using the standard protocol during bolus administration of intravenous contrast. Multiplanar reconstructed images including MIPs were obtained and reviewed to evaluate the vascular anatomy.  Contrast: OMNIPAQUE IOHEXOL 350 MG/ML SOLN  Comparison: No priors.  Findings:  Mediastinum: The study is limited by extensive patient respiratory motion.  With these limitations in mind there is no central, lobar or proximal segmental sized filling defect to suggest pulmonary embolism.  Distal segmental and subsegmental sized filling defects cannot be entirely excluded secondary to respiratory motion. Moderate cardiomegaly with right ventricular and right atrial dilatation.  The pulmonic trunk is dilated (3.4 cm in diameter), suggestive of pulmonary arterial hypertension. There is atherosclerosis of the thoracic aorta, the great vessels of the mediastinum and the coronary arteries, including calcified atherosclerotic plaque in the left main, left anterior descending and left circumflex coronary arteries. Calcifications of the aortic valve.No pathologically enlarged  mediastinal or hilar lymph nodes. Esophagus is unremarkable in appearance.  Lungs/Pleura: No acute consolidative airspace disease.  No pleural effusions.  No definite suspicious appearing pulmonary nodules or masses are identified. No pleural effusions.  Upper Abdomen: Unremarkable.  Musculoskeletal: There are no aggressive appearing lytic or blastic lesions noted in the visualized portions of the skeleton.  IMPRESSION: 1.  Although the study is significantly limited by patient respiratory motion there is no evidence to suggest central, lobar or proximal segmental sized pulmonary embolism. 2.  Dilatation of the pulmonic trunk suggestive of pulmonary arterial hypertension.  There is also dilatation of the  right ventricle and right atrium. 3. Atherosclerosis, including left main and two-vessel coronary artery disease. Please note that although the presence of coronary artery calcium documents the presence of coronary artery disease, the severity of this disease and any potential stenosis cannot be assessed on this non-gated CT examination.  Assessment for potential risk factor modification, dietary therapy or pharmacologic therapy may be warranted, if clinically indicated.   Original Report Authenticated By: Trudie Reed, M.D.    Dg Chest Portable 1 View  10/03/2012  *RADIOLOGY REPORT*  Clinical Data: Chest pain.  PORTABLE CHEST - 1 VIEW  Comparison: No priors.  Findings: Lung volumes are normal.  No consolidative airspace disease.  No pleural effusions.  No pneumothorax.  No evidence of pulmonary edema.  Borderline cardiomegaly.  Upper mediastinal contours are within normal limits.  Atherosclerosis of the thoracic aorta.  IMPRESSION: 1.  Borderline cardiomegaly. 2.  Atherosclerosis.   Original Report Authenticated By: Trudie Reed, M.D.     Cardiac Studies:  ECG:  Atrial flutter with RBBB     Telemetry:  NSR no VT  Echo:   Medications:   . aspirin EC  81 mg Oral Daily  . clopidogrel  75 mg Oral Once  . enoxaparin (LOVENOX) injection  40 mg Subcutaneous Q24H  . isosorbide mononitrate  30 mg Oral Daily  . linagliptin  5 mg Oral Daily  . lisinopril  20 mg Oral QPM  . [START ON 10/06/2012] metFORMIN  1,000 mg Oral BID WC  . metoprolol tartrate  25 mg Oral QPM  . nitroGLYCERIN      . omega-3 acid ethyl esters  2 g Oral BID  . simvastatin  20 mg Oral q1800     . sodium chloride Stopped (10/04/12 0047)    Assessment/Plan:  Chest Pain:  Obese diabetic with cypher stent to diagonal 2006  Rec. Cath in am  Continue DAT Flutter:  Start heparin and increase metoprolol to bid.  Will need to address post cath  Echo DM: Discussed low carb diet.  Target hemoglobin A1c is 6.5 or less.  Continue  current medications. Including ACE hold glucophage for cath Chol:  Continue statin  Discussed care with wife  Patient would like radial approach if possible  [name] 10/04/2012, 9:29 AM

## 2012-10-04 NOTE — Plan of Care (Signed)
Problem: Phase I Progression Outcomes Goal: Aspirin unless contraindicated Outcome: Not Applicable Date Met:  10/04/12 plavix given

## 2012-10-05 ENCOUNTER — Encounter (HOSPITAL_COMMUNITY)
Admission: EM | Disposition: A | Discharge status: Home / Self Care | Payer: Self-pay | Source: Home / Self Care | Attending: Cardiology

## 2012-10-05 ENCOUNTER — Ambulatory Visit (HOSPITAL_COMMUNITY): Admit: 2012-10-05 | Payer: Self-pay | Admitting: Cardiovascular Disease

## 2012-10-05 DIAGNOSIS — I4892 Unspecified atrial flutter: Secondary | ICD-10-CM

## 2012-10-05 DIAGNOSIS — I2510 Atherosclerotic heart disease of native coronary artery without angina pectoris: Secondary | ICD-10-CM

## 2012-10-05 HISTORY — DX: Unspecified atrial flutter: I48.92

## 2012-10-05 HISTORY — PX: LEFT HEART CATHETERIZATION WITH CORONARY ANGIOGRAM: SHX5451

## 2012-10-05 LAB — POCT I-STAT 3, VENOUS BLOOD GAS (G3P V)
Bicarbonate: 22.5 mEq/L (ref 20.0–24.0)
O2 Saturation: 63 %
TCO2: 24 mmol/L (ref 0–100)
pCO2, Ven: 43.3 mmHg — ABNORMAL LOW (ref 45.0–50.0)
pH, Ven: 7.325 — ABNORMAL HIGH (ref 7.250–7.300)

## 2012-10-05 LAB — POCT I-STAT 3, ART BLOOD GAS (G3+)
Acid-base deficit: 3 mmol/L — ABNORMAL HIGH (ref 0.0–2.0)
Bicarbonate: 21.8 mEq/L (ref 20.0–24.0)
O2 Saturation: 92 %
TCO2: 23 mmol/L (ref 0–100)
pO2, Arterial: 65 mmHg — ABNORMAL LOW (ref 80.0–100.0)

## 2012-10-05 LAB — CBC
HCT: 40.4 % (ref 39.0–52.0)
Platelets: 214 10*3/uL (ref 150–400)
RBC: 4.39 MIL/uL (ref 4.22–5.81)
RDW: 13.7 % (ref 11.5–15.5)
WBC: 8.5 10*3/uL (ref 4.0–10.5)

## 2012-10-05 LAB — GLUCOSE, CAPILLARY: Glucose-Capillary: 116 mg/dL — ABNORMAL HIGH (ref 70–99)

## 2012-10-05 LAB — HEPARIN LEVEL (UNFRACTIONATED): Heparin Unfractionated: 0.31 IU/mL (ref 0.30–0.70)

## 2012-10-05 SURGERY — LEFT HEART CATHETERIZATION WITH CORONARY ANGIOGRAM
Anesthesia: LOCAL

## 2012-10-05 MED ORDER — METOPROLOL TARTRATE 25 MG PO TABS
25.0000 mg | ORAL_TABLET | Freq: Three times a day (TID) | ORAL | Status: DC
  Administered 2012-10-05 (×2): 25 mg via ORAL
  Filled 2012-10-05 (×3): qty 1

## 2012-10-05 MED ORDER — HEPARIN (PORCINE) IN NACL 2-0.9 UNIT/ML-% IJ SOLN
INTRAMUSCULAR | Status: AC
  Filled 2012-10-05: qty 1000

## 2012-10-05 MED ORDER — MIDAZOLAM HCL 2 MG/2ML IJ SOLN
INTRAMUSCULAR | Status: AC
  Filled 2012-10-05: qty 2

## 2012-10-05 MED ORDER — LIDOCAINE HCL (PF) 1 % IJ SOLN
INTRAMUSCULAR | Status: AC
  Filled 2012-10-05: qty 30

## 2012-10-05 MED ORDER — SODIUM CHLORIDE 0.9 % IV SOLN: 1.0000 mL/kg/h | INTRAVENOUS | Status: AC

## 2012-10-05 MED ORDER — ASPIRIN 81 MG PO CHEW
CHEWABLE_TABLET | ORAL | Status: AC
  Filled 2012-10-05: qty 1

## 2012-10-05 MED ORDER — RIVAROXABAN 20 MG PO TABS
20.0000 mg | ORAL_TABLET | Freq: Every day | ORAL | Status: DC
  Administered 2012-10-05: 20 mg via ORAL
  Filled 2012-10-05 (×2): qty 1

## 2012-10-05 MED ORDER — LISINOPRIL 10 MG PO TABS
10.0000 mg | ORAL_TABLET | Freq: Every evening | ORAL | Status: DC
  Filled 2012-10-05: qty 1

## 2012-10-05 MED ORDER — METOPROLOL TARTRATE 50 MG PO TABS
50.0000 mg | ORAL_TABLET | Freq: Two times a day (BID) | ORAL | Status: DC
Start: 2012-10-05 — End: 2012-10-06
  Administered 2012-10-05 – 2012-10-06 (×2): 50 mg via ORAL
  Filled 2012-10-05 (×3): qty 1

## 2012-10-05 MED ORDER — FENTANYL CITRATE 0.05 MG/ML IJ SOLN
INTRAMUSCULAR | Status: AC
  Filled 2012-10-05: qty 2

## 2012-10-05 MED ORDER — HEPARIN (PORCINE) IN NACL 100-0.45 UNIT/ML-% IJ SOLN
1750.0000 [IU]/h | INTRAMUSCULAR | Status: DC
  Filled 2012-10-05: qty 250

## 2012-10-05 NOTE — Progress Notes (Signed)
 Patient Name: Jackson Parks Date of Encounter: 10/05/2012     Active Problems:   * No active hospital problems. *    SUBJECTIVE  Very nice gentleman I have known for awhile.  Has DES in diagonal.  New onset of symptoms.  With questioning, he does volunteer that he snores, has had OSA since before PCI, and has never worn the mask.  In addition, he has trouble sleeping, and his primary has given him Ambien.  Prior echo 2008 did NOT demonstrate RV enlargement.  Now with atrial flutter.    CURRENT MEDS . aspirin EC  81 mg Oral Daily  . insulin aspart  0-15 Units Subcutaneous TID WC  . insulin aspart  0-5 Units Subcutaneous QHS  . isosorbide mononitrate  30 mg Oral Daily  . linagliptin  5 mg Oral Daily  . lisinopril  20 mg Oral QPM  . metoprolol tartrate  25 mg Oral BID  . omega-3 acid ethyl esters  2 g Oral BID  . simvastatin  20 mg Oral q1800  . sodium chloride  3 mL Intravenous Q12H    OBJECTIVE  Filed Vitals:   10/04/12 1647 10/04/12 2057 10/05/12 0405 10/05/12 0406  BP: 100/65 105/51  118/69  Pulse:  118  108  Temp:  97.9 F (36.6 C)  98.5 F (36.9 C)  TempSrc:  Oral    Resp:  19  20  Height:      Weight:   291 lb (131.997 kg)   SpO2:  97%  95%    Intake/Output Summary (Last 24 hours) at 10/05/12 0826 Last data filed at 10/05/12 0700  Gross per 24 hour  Intake 1182.66 ml  Output   2500 ml  Net -1317.34 ml   Filed Weights   10/03/12 2007 10/04/12 0206 10/05/12 0405  Weight: 293 lb (132.904 kg) 290 lb 8 oz (131.77 kg) 291 lb (131.997 kg)    PHYSICAL EXAM  General: Pleasant, NAD.  Moderately obese.   Neuro: Alert and oriented X 3. Moves all extremities spontaneously. Psych: Normal affect. HEENT:  Normal  Neck: Supple without bruits or JVD. Lungs:  Resp regular and unlabored, CTA. Heart: irregular rhythm with distant HS.   Abdomen: Soft, non-tender, non-distended, BS + x 4.  Extremities: No clubbing, cyanosis or edema. DP/PT/Radials 2+ and equal  bilaterally.  Accessory Clinical Findings  CBC  Recent Labs  10/03/12 1910 10/04/12 0129 10/05/12 0615  WBC 11.0* 10.3 8.5  NEUTROABS  --  6.0  --   HGB 14.2 14.4 13.8  HCT 40.2 40.2 40.4  MCV 91.0 90.7 92.0  PLT 222 213 214   Basic Metabolic Panel  Recent Labs  10/03/12 1910 10/04/12 0129 10/04/12 0535  NA 135  --  134*  K 4.5  --  4.2  CL 100  --  101  CO2 24  --  23  GLUCOSE 135*  --  137*  BUN 20  --  16  CREATININE 0.93 0.84 0.86  CALCIUM 9.8  --  9.3   Liver Function Tests  Recent Labs  10/03/12 1910  AST 12  ALT 14  ALKPHOS 26*  BILITOT 0.2*  PROT 7.5  ALBUMIN 3.8   No results found for this basename: LIPASE, AMYLASE,  in the last 72 hours Cardiac Enzymes  Recent Labs  10/04/12 0130 10/04/12 0535 10/04/12 1155  TROPONINI <0.30 <0.30 <0.30   BNP No components found with this basename: POCBNP,  D-Dimer No results found for this basename: DDIMER,    in the last 72 hours Hemoglobin A1C  Recent Labs  10/04/12 0129  HGBA1C 7.1*   Fasting Lipid Panel  Recent Labs  10/04/12 0535  CHOL 190  HDL 51  LDLCALC 103*  TRIG 180*  CHOLHDL 3.7   Thyroid Function Tests  Recent Labs  10/04/12 0129  TSH 1.748    TELE  Flutter with faster rate when sitting up.   ECG  Atrial flutter.  RBBB.    Radiology/Studies  Ct Angio Chest W/cm &/or Wo Cm  10/03/2012  *RADIOLOGY REPORT*  Clinical Data: Chest pain.  Resting tachycardia.  Evaluate for pulmonary embolism.  CT ANGIOGRAPHY CHEST  Technique:  Multidetector CT imaging of the chest using the standard protocol during bolus administration of intravenous contrast. Multiplanar reconstructed images including MIPs were obtained and reviewed to evaluate the vascular anatomy.  Contrast: 200mL OMNIPAQUE IOHEXOL 350 MG/ML SOLN  Comparison: No priors.  Findings:  Mediastinum: The study is limited by extensive patient respiratory motion.  With these limitations in mind there is no central, lobar or  proximal segmental sized filling defect to suggest pulmonary embolism.  Distal segmental and subsegmental sized filling defects cannot be entirely excluded secondary to respiratory motion. Moderate cardiomegaly with right ventricular and right atrial dilatation.  The pulmonic trunk is dilated (3.4 cm in diameter), suggestive of pulmonary arterial hypertension. There is atherosclerosis of the thoracic aorta, the great vessels of the mediastinum and the coronary arteries, including calcified atherosclerotic plaque in the left main, left anterior descending and left circumflex coronary arteries. Calcifications of the aortic valve.No pathologically enlarged mediastinal or hilar lymph nodes. Esophagus is unremarkable in appearance.  Lungs/Pleura: No acute consolidative airspace disease.  No pleural effusions.  No definite suspicious appearing pulmonary nodules or masses are identified. No pleural effusions.  Upper Abdomen: Unremarkable.  Musculoskeletal: There are no aggressive appearing lytic or blastic lesions noted in the visualized portions of the skeleton.  IMPRESSION: 1.  Although the study is significantly limited by patient respiratory motion there is no evidence to suggest central, lobar or proximal segmental sized pulmonary embolism. 2.  Dilatation of the pulmonic trunk suggestive of pulmonary arterial hypertension.  There is also dilatation of the right ventricle and right atrium. 3. Atherosclerosis, including left main and two-vessel coronary artery disease. Please note that although the presence of coronary artery calcium documents the presence of coronary artery disease, the severity of this disease and any potential stenosis cannot be assessed on this non-gated CT examination.  Assessment for potential risk factor modification, dietary therapy or pharmacologic therapy may be warranted, if clinically indicated.   Original Report Authenticated By: Daniel Entrikin, M.D.    Dg Chest Portable 1  View  10/03/2012  *RADIOLOGY REPORT*  Clinical Data: Chest pain.  PORTABLE CHEST - 1 VIEW  Comparison: No priors.  Findings: Lung volumes are normal.  No consolidative airspace disease.  No pleural effusions.  No pneumothorax.  No evidence of pulmonary edema.  Borderline cardiomegaly.  Upper mediastinal contours are within normal limits.  Atherosclerosis of the thoracic aorta.  IMPRESSION: 1.  Borderline cardiomegaly. 2.  Atherosclerosis.   Original Report Authenticated By: Daniel Entrikin, M.D.     ASSESSMENT AND PLAN  1.  New onset atrial flutter with chest pain -- neg enzymes.   2.  RV enlargement, RA enlargement, Pulm HTN  -- normal echo 2008  ([facility]) 3.  CAD with prior DES to the diagonal 4.  History of untreated OSA  - prescribed, not used.       For cardiac catheterization today.  Favor R and L heart given elevation.  After, if patent, will need both consult for OSA and EP consult for consideration of flutter ablation.    Signed, Boleslaus Holloway MD, FACC, FSCAI 

## 2012-10-05 NOTE — Consult Note (Signed)
ELECTROPHYSIOLOGY CONSULT NOTE  Patient ID: Jackson Parks MRN: [id number], DOB/AGE: 1941-05-16   Admit date: 10/03/2012 Date of Consult: 10/05/2012  Primary Cardiologist: Shawnie Pons, MD Reason for Consultation: Atrial flutter  History of Present Illness [name] is a 72 year old man with CAD, preserved LV function, untreated OSA, DM and COPD who was admitted over the weekend with chest pain. [name] reports chest pain "like indigestion, burning" while loading wood in his yard. He denies SOB, palpitations, nausea or diaphoresis. He decided to stop working and went inside to sit down. His CP subsided with rest. He then went to take a shower and while showering his CP recurred so decided to come to [facility] ED. Again, he denies SOB, palpitations, nausea or diaphoresis. On presentation he was found to have rapid atrial flutter. Given his exertional CP and untreated OSA with possible RV enlargement on echo, he underwent a left and right heart catheterization today. This revealed nonobstructive CAD with widely patent stent to diagonal, normal LV function and normal right heart pressures. He remains in atrial flutter with elevated rates. He denies palpitations, SOB, dizziness, near syncope or syncope. He states he is unaware of his arrhythmia. The only other symptom he reports is fatigue which he has noticed gradually worsening over the past 2 years since his retirement.   Past Medical History Past Medical History  Diagnosis Date  . CAD (coronary artery disease)   . Hypercholesterolemia   . DM (dermatomyositis)   . Obesity   . COPD (chronic obstructive pulmonary disease)     Past Surgical History Past Surgical History  Procedure Laterality Date  . Plantar fasciectomy  2005  . Skin lesions      multiple removed  . Steting      stenting of the diagonal branch using a 2.5 x 23 Cypher drug-eluting stent followed by post dilatation     Allergies/Intolerances No Known  Allergies  Inpatient Medications . aspirin EC  81 mg Oral Daily  . insulin aspart  0-15 Units Subcutaneous TID WC  . insulin aspart  0-5 Units Subcutaneous QHS  . isosorbide mononitrate  30 mg Oral Daily  . linagliptin  5 mg Oral Daily  . lisinopril  20 mg Oral QPM  . metoprolol tartrate  25 mg Oral TID  . omega-3 acid ethyl esters  2 g Oral BID  . simvastatin  20 mg Oral q1800   . sodium chloride 1 mL/kg/hr (10/05/12 1123)  . heparin      Family History Family History  Problem Relation Age of Onset  . Heart attack Father 29    deceased  . Heart failure Mother 49    deceased  . Coronary artery disease Brother 83  . Peripheral vascular disease Brother 67     Social History Social History  . Marital Status: Married   Occupational History  . truck driver    Social History Main Topics  . Smoking status: Former Smoker    Quit date: 12/23/2000  . Smokeless tobacco: Not on file  . Alcohol Use: Yes     Comment: two margaritas per year  . Drug Use: No   Review of Systems General: No chills, fever, night sweats or weight changes  Cardiovascular:  No dyspnea on exertion, edema, orthopnea, palpitations, paroxysmal nocturnal dyspnea Dermatological: No rash, lesions or masses Respiratory: No cough Urologic: No hematuria, dysuria Abdominal: No nausea, vomiting, diarrhea, bright red blood per rectum, melena, or hematemesis Neurologic: No visual changes, weakness, changes in  mental status All other systems reviewed and are otherwise negative except as noted above.  Physical Exam Blood pressure 136/99, pulse 120, temperature 97.5 F (36.4 C), temperature source Oral, resp. rate 18, height 5\' 11"  (1.803 m), weight 291 lb (131.997 kg), SpO2 95.00%.  General: Well developed, well appearing, overweight 72 year old male in no acute distress. HEENT: Normocephalic, atraumatic. EOMs intact. Sclera nonicteric. Oropharynx clear.  Neck: Supple without bruits. No JVD. Lungs: Diminished  breath sounds bilaterally. Respirations regular and unlabored, CTA bilaterally. No wheezes, rales or rhonchi. Heart: Tachycardic, regular S1, S2 present. No murmurs, rub, S3 or S4. Abdomen: Soft, non-tender, non-distended. BS present x 4 quadrants. No hepatosplenomegaly.  Extremities: No clubbing, cyanosis or edema. DP/PT/Radials 2+ and equal bilaterally. Psych: Normal affect. Neuro: Alert and oriented X 3. Moves all extremities spontaneously. Musculoskeletal: No kyphosis. Skin: Intact. Warm and dry. No rashes or petechiae in exposed areas.   Labs  Recent Labs  10/04/12 0130 10/04/12 0535 10/04/12 1155  TROPONINI <0.30 <0.30 <0.30   Lab Results  Component Value Date   WBC 8.5 10/05/2012   HGB 13.8 10/05/2012   HCT 40.4 10/05/2012   MCV 92.0 10/05/2012   PLT 214 10/05/2012    Recent Labs Lab 10/03/12 1910  10/04/12 0535  NA 135  --  134*  K 4.5  --  4.2  CL 100  --  101  CO2 24  --  23  BUN 20  --  16  CREATININE 0.93  < > 0.86  CALCIUM 9.8  --  9.3  PROT 7.5  --   --   BILITOT 0.2*  --   --   ALKPHOS 26*  --   --   ALT 14  --   --   AST 12  --   --   GLUCOSE 135*  --  137*  < > = values in this interval not displayed.  Lab Results  Component Value Date   CHOL 190 10/04/2012   HDL 51 10/04/2012   LDLCALC 103* 10/04/2012   TRIG 180* 10/04/2012    Recent Labs  10/04/12 0129  TSH 1.748    Recent Labs  10/03/12 1910  INR 1.04    Radiology/Studies Ct Angio Chest W/cm &/or Wo Cm  10/03/2012  *RADIOLOGY REPORT*  Clinical Data: Chest pain.  Resting tachycardia.  Evaluate for pulmonary embolism.  CT ANGIOGRAPHY CHEST  Technique:  Multidetector CT imaging of the chest using the standard protocol during bolus administration of intravenous contrast. Multiplanar reconstructed images including MIPs were obtained and reviewed to evaluate the vascular anatomy.  Contrast: OMNIPAQUE IOHEXOL 350 MG/ML SOLN  Comparison: No priors.  Findings:  Mediastinum: The study is  limited by extensive patient respiratory motion.  With these limitations in mind there is no central, lobar or proximal segmental sized filling defect to suggest pulmonary embolism.  Distal segmental and subsegmental sized filling defects cannot be entirely excluded secondary to respiratory motion. Moderate cardiomegaly with right ventricular and right atrial dilatation.  The pulmonic trunk is dilated (3.4 cm in diameter), suggestive of pulmonary arterial hypertension. There is atherosclerosis of the thoracic aorta, the great vessels of the mediastinum and the coronary arteries, including calcified atherosclerotic plaque in the left main, left anterior descending and left circumflex coronary arteries. Calcifications of the aortic valve.No pathologically enlarged mediastinal or hilar lymph nodes. Esophagus is unremarkable in appearance.  Lungs/Pleura: No acute consolidative airspace disease.  No pleural effusions.  No definite suspicious appearing pulmonary nodules or  masses are identified. No pleural effusions.  Upper Abdomen: Unremarkable.  Musculoskeletal: There are no aggressive appearing lytic or blastic lesions noted in the visualized portions of the skeleton.  IMPRESSION: 1.  Although the study is significantly limited by patient respiratory motion there is no evidence to suggest central, lobar or proximal segmental sized pulmonary embolism. 2.  Dilatation of the pulmonic trunk suggestive of pulmonary arterial hypertension.  There is also dilatation of the right ventricle and right atrium. 3. Atherosclerosis, including left main and two-vessel coronary artery disease. Please note that although the presence of coronary artery calcium documents the presence of coronary artery disease, the severity of this disease and any potential stenosis cannot be assessed on this non-gated CT examination.  Assessment for potential risk factor modification, dietary therapy or pharmacologic therapy may be warranted, if  clinically indicated.   Original Report Authenticated By: Trudie Reed, M.D.    Dg Chest Portable 1 View  10/03/2012  *RADIOLOGY REPORT*  Clinical Data: Chest pain.  PORTABLE CHEST - 1 VIEW  Comparison: No priors.  Findings: Lung volumes are normal.  No consolidative airspace disease.  No pleural effusions.  No pneumothorax.  No evidence of pulmonary edema.  Borderline cardiomegaly.  Upper mediastinal contours are within normal limits.  Atherosclerosis of the thoracic aorta.  IMPRESSION: 1.  Borderline cardiomegaly. 2.  Atherosclerosis.   Original Report Authenticated By: Trudie Reed, M.D.     Echocardiogram  Done but report not in EPIC at this time  12-lead ECG on admission shows rapid atrial flutter at 123 bpm with RBBB Telemetry shows persistent atrial flutter   Assessment and Plan 1. Atrial flutter, typical [name] presents with rapid atrial flutter and chest pain. His cardiac catheterization today shows nonobstructive CAD, normal LV function and normal right heart pressures. Treatment options for atrial flutter were reviewed in detail with [name] and his family today, including medical therapy vs. EPS +RF ablation. It was explained that EPS +RF ablation of typical atrial flutter is 90-95% successful as a curative treatment approach. Risk, benefits, and alternatives to EP study and radiofrequency ablation were also discussed. These risks include but are not limited to stroke, bleeding, vascular damage, tamponade, perforation, worsening renal function and death. Dr. Graciela Husbands to see.  Signed, Rick Duff, PA-C 10/05/2012, 1:53 PM  The patient was found to be in atrial flutter when he presented unassociated with palpitations and with intermittent exertional symptoms. Hence it is not clear that is atrial flutter as I would not have anticipated that the atrial flutter would be paroxysmal and would have plus anticipated that effort on both occasions should be problematic history  atrial flutter or the cause.  In any case he has a CHADS-VASc score of 3 and is alternative for long-term anticoagulation catheter ablation is reasonable. He was treated with heparin and we will start him on a NOAC. They'll require at least 48 hours prior to proceed with TEE. Given his size and his history of sleep apnea I would prefer to do it with anesthesia support. We will try to arrange for that on Wednesday or Thursday.  We will work on augmented rate control in the interim and if we cannot coordinate anesthesia for catheter ablation by Wednesday or Thursday, we will do a TE guided cardioversion and abrasion back at a later date for ablation.

## 2012-10-05 NOTE — Interval H&P Note (Signed)
History and Physical Interval Note:  10/05/2012 9:58 AM  Jackson Parks  has presented today for surgery, with the diagnosis of cp  The various methods of treatment have been discussed with the patient and family. After consideration of risks, benefits and other options for treatment, the patient has consented to  Procedure(s): LEFT HEART CATHETERIZATION WITH CORONARY ANGIOGRAM (N/A) as a surgical intervention .  The patient's history has been reviewed, patient examined, no change in status, stable for surgery.  I have reviewed the patient's chart and labs.  Questions were answered to the patient's satisfaction.     Theron Arista [facility] 10/05/2012 9:58 AM

## 2012-10-05 NOTE — CV Procedure (Signed)
   Cardiac Catheterization Procedure Note  Name: Jackson Parks MRN: [id number] DOB: 06/12/1941  Procedure: Right Heart Cath, Left Heart Cath, Selective Coronary Angiography, LV angiography  Indication: 72 year old white male with history of coronary disease status post DES of the diagonal. He presents with new onset of atrial flutter. He has a history of obstructive sleep apnea.   Procedural Details: The right groin was prepped, draped, and anesthetized with 1% lidocaine. Using the modified Seldinger technique a 5 French sheath was placed in the right femoral artery and a 7 French sheath was placed in the right femoral vein. A Swan-Ganz catheter was used for the right heart catheterization. Standard protocol was followed for recording of right heart pressures and sampling of oxygen saturations. Fick cardiac output was calculated. Standard Judkins catheters were used for selective coronary angiography and left ventriculography. There were no immediate procedural complications. The patient was transferred to the post catheterization recovery area for further monitoring.  Procedural Findings: Hemodynamics RA 12/13 with a mean of 11 mmHg RV 29/9 mmHg PA 30/13 mmHg with a mean of 21 mmHg PCWP 20/19 with a mean of 17 mmHg LV 108/13 mmHg AO 108/80 with a mean of 94 mmHg  Is no significant aortic or mitral valve gradient  Oxygen saturations: PA 63% AO 92%  Cardiac Output (Fick) 6.04 L per minute  Cardiac Index (Fick) 2.45 L per minute per meter square   Coronary angiography: Coronary dominance: right  Left mainstem: Normal.  Left anterior descending (LAD): There is diffuse 30% narrowing of the mid LAD after the first diagonal. The first diagonal stent is widely patent. At the distal stent margin there is a focal 50% stenosis.  Left circumflex (LCx): The left circumflex is a large dominant vessel. It has mild irregularities less than 20%.  Right coronary artery (RCA): This is a small  nondominant vessel and is normal.  Left ventriculography: Left ventricular systolic function is normal, LVEF is estimated at 55-65%, there is no significant mitral regurgitation   Final Conclusions:   1. Nonobstructive coronary disease. The stent in the diagonal is widely patent. 2. Normal left ventricular function.  3. Normal right heart pressures  Recommendations: We will obtain an EP consult for consideration of atrial flutter ablation.   Theron Arista [facility] 10/05/2012, 10:28 AM

## 2012-10-05 NOTE — Care Management Note (Unsigned)
    Page 1 of 1   10/05/2012     3:34:02 PM   CARE MANAGEMENT NOTE 10/05/2012  Patient:  Jackson Parks, Jackson Parks   Account Number:  [id number]  Date Initiated:  10/05/2012  Documentation initiated by:  Adasia Hoar  Subjective/Objective Assessment:   PT ADM ON 10/03/12 WITH CHEST PAIN, RAPID HR, AFLUTTER. PTA, PT INDEPENDENT, LIVES WITH SPOUSE.     Action/Plan:   WILL FOLLOW FOR HOME NEEDS AS PT PROGRESSES.   Anticipated DC Date:  10/06/2012   Anticipated DC Plan:  HOME/SELF CARE      DC Planning Services  CM consult      Choice offered to / List presented to:             Status of service:  In process, will continue to follow Medicare Important Message given?   (If response is "NO", the following Medicare IM given date fields will be blank) Date Medicare IM given:   Date Additional Medicare IM given:    Discharge Disposition:    Per UR Regulation:  Reviewed for med. necessity/level of care/duration of stay  If discussed at Long Length of Stay Meetings, dates discussed:    Comments:

## 2012-10-05 NOTE — Progress Notes (Signed)
ANTICOAGULATION CONSULT NOTE - Follow up Consult  Pharmacy Consult for Heparin Indication: aflutter; chest pain - r/o ACS  No Known Allergies  Patient Measurements: Height: 5\' 11"  (180.3 cm) Weight: 291 lb (131.997 kg) IBW/kg (Calculated) : 75.3 Heparin Dosing Weight: 105 kg  Vital Signs: Temp: 97.5 F (36.4 C) (02/24 0916) Temp src: Oral (02/24 0916) BP: 146/96 mmHg (02/24 1204) Pulse Rate: 118 (02/24 1204)  Labs:  Recent Labs  10/03/12 1910 10/04/12 0129 10/04/12 0130 10/04/12 0535 10/04/12 1155 10/04/12 1747 10/05/12 0615  HGB 14.2 14.4  --   --   --   --  13.8  HCT 40.2 40.2  --   --   --   --  40.4  PLT 222 213  --   --   --   --  214  APTT 26  --   --   --   --   --   --   LABPROT 13.5  --   --   --   --   --   --   INR 1.04  --   --   --   --   --   --   HEPARINUNFRC  --   --   --   --   --  0.13* 0.31  CREATININE 0.93 0.84  --  0.86  --   --   --   TROPONINI  --   --  <0.30 <0.30 <0.30  --   --     Estimated Creatinine Clearance: 112.4 ml/min (by C-G formula based on Cr of 0.86).   Medical History: Past Medical History  Diagnosis Date  . CAD (coronary artery disease)   . Hypercholesterolemia   . DM (dermatomyositis)   . Obesity   . COPD (chronic obstructive pulmonary disease)     Medications:  Prescriptions prior to admission  Medication Sig Dispense Refill  . aspirin EC 81 MG tablet Take 81 mg by mouth daily.      . Cinnamon 500 MG capsule Take 500 mg by mouth daily.      . fish oil-omega-3 fatty acids 1000 MG capsule Take 1 g by mouth 2 (two) times daily.       Marland Kitchen glimepiride (AMARYL) 2 MG tablet Take 2 mg by mouth daily before breakfast.      . ibuprofen (ADVIL,MOTRIN) 200 MG tablet Take 800 mg by mouth every 8 (eight) hours as needed for pain.      Marland Kitchen lisinopril (PRINIVIL,ZESTRIL) 20 MG tablet Take 20 mg by mouth every evening.       . lovastatin (MEVACOR) 40 MG tablet take 1 tablet by mouth at bedtime  30 tablet  5  . metoprolol tartrate  (LOPRESSOR) 25 MG tablet Take 25 mg by mouth every evening.       Marland Kitchen PLAVIX 75 MG tablet take 1 tablet by mouth once daily  30 tablet  1  . PRESCRIPTION MEDICATION       . simvastatin (ZOCOR) 40 MG tablet Take 40 mg by mouth every evening.      . sitaGLIPtan-metformin (JANUMET) 50-1000 MG per tablet Take 1 tablet by mouth 2 (two) times daily with a meal.      . zolpidem (AMBIEN) 10 MG tablet Take 10 mg by mouth at bedtime as needed for sleep.        Assessment: 72 y.o. male with chest pain, new onset afib on IV heparin, s/p cath today. Plan for EP consult for a-flutter ablation.  Will resume heparin 8 hrs post sheath removal (1112) Heparin level (0.31) is at-goal this morning on 1750 units/hr.   Goal of Therapy: Heparin level 0.3-0.7 units/ml Monitor platelets by anticoagulation protocol: Yes   Plan:  1. Restart IV heparin with no bolus 1750 units/hr this evening at 1915 2. F/u heparin level at 0130  Bayard Hugger, PharmD, BCPS  Clinical Pharmacist  Pager: [phone]  10/05/2012,12:23 PM

## 2012-10-05 NOTE — Progress Notes (Signed)
ANTICOAGULATION CONSULT NOTE - Follow up Consult  Pharmacy Consult for Heparin Indication: aflutter; chest pain - r/o ACS  No Known Allergies  Patient Measurements: Height: 5\' 11"  (180.3 cm) Weight: 291 lb (131.997 kg) IBW/kg (Calculated) : 75.3 Heparin Dosing Weight: 105 kg  Vital Signs: Temp: 98.5 F (36.9 C) (02/24 0406) Temp src: Oral (02/23 2057) BP: 118/69 mmHg (02/24 0406) Pulse Rate: 108 (02/24 0406)  Labs:  Recent Labs  10/03/12 1910 10/04/12 0129 10/04/12 0130 10/04/12 0535 10/04/12 1155 10/04/12 1747 10/05/12 0615  HGB 14.2 14.4  --   --   --   --  13.8  HCT 40.2 40.2  --   --   --   --  40.4  PLT 222 213  --   --   --   --  214  APTT 26  --   --   --   --   --   --   LABPROT 13.5  --   --   --   --   --   --   INR 1.04  --   --   --   --   --   --   HEPARINUNFRC  --   --   --   --   --  0.13* 0.31  CREATININE 0.93 0.84  --  0.86  --   --   --   TROPONINI  --   --  <0.30 <0.30 <0.30  --   --     Estimated Creatinine Clearance: 112.4 ml/min (by C-G formula based on Cr of 0.86).   Medical History: Past Medical History  Diagnosis Date  . CAD (coronary artery disease)   . Hypercholesterolemia   . DM (dermatomyositis)   . Obesity   . COPD (chronic obstructive pulmonary disease)     Medications:  Prescriptions prior to admission  Medication Sig Dispense Refill  . aspirin EC 81 MG tablet Take 81 mg by mouth daily.      . Cinnamon 500 MG capsule Take 500 mg by mouth daily.      . fish oil-omega-3 fatty acids 1000 MG capsule Take 1 g by mouth 2 (two) times daily.       Marland Kitchen glimepiride (AMARYL) 2 MG tablet Take 2 mg by mouth daily before breakfast.      . ibuprofen (ADVIL,MOTRIN) 200 MG tablet Take 800 mg by mouth every 8 (eight) hours as needed for pain.      Marland Kitchen lisinopril (PRINIVIL,ZESTRIL) 20 MG tablet Take 20 mg by mouth every evening.       . lovastatin (MEVACOR) 40 MG tablet take 1 tablet by mouth at bedtime  30 tablet  5  . metoprolol tartrate  (LOPRESSOR) 25 MG tablet Take 25 mg by mouth every evening.       Marland Kitchen PLAVIX 75 MG tablet take 1 tablet by mouth once daily  30 tablet  1  . PRESCRIPTION MEDICATION       . simvastatin (ZOCOR) 40 MG tablet Take 40 mg by mouth every evening.      . sitaGLIPtan-metformin (JANUMET) 50-1000 MG per tablet Take 1 tablet by mouth 2 (two) times daily with a meal.      . zolpidem (AMBIEN) 10 MG tablet Take 10 mg by mouth at bedtime as needed for sleep.        Assessment: 72 y.o. male with chest pain on IV heparin. Plan for cath later today. Heparin level (0.31) is at-goal on 1750 units/hr.  Goal of Therapy: Heparin level 0.3-0.7 units/ml Monitor platelets by anticoagulation protocol: Yes   Plan:  1. Continue IV heparin at 1750 units/hr.  2. Follow-up post-cath.   Lorre Munroe, PharmD 10/05/2012,7:04 AM

## 2012-10-05 NOTE — H&P (View-Only) (Signed)
Patient Name: Jackson Parks Date of Encounter: 10/05/2012     Active Problems:   * No active hospital problems. *    SUBJECTIVE  Very nice gentleman I have known for awhile.  Has DES in diagonal.  New onset of symptoms.  With questioning, he does volunteer that he snores, has had OSA since before PCI, and has never worn the mask.  In addition, he has trouble sleeping, and his primary has given him Ambien.  Prior echo 2008 did NOT demonstrate RV enlargement.  Now with atrial flutter.    CURRENT MEDS . aspirin EC  81 mg Oral Daily  . insulin aspart  0-15 Units Subcutaneous TID WC  . insulin aspart  0-5 Units Subcutaneous QHS  . isosorbide mononitrate  30 mg Oral Daily  . linagliptin  5 mg Oral Daily  . lisinopril  20 mg Oral QPM  . metoprolol tartrate  25 mg Oral BID  . omega-3 acid ethyl esters  2 g Oral BID  . simvastatin  20 mg Oral q1800  . sodium chloride  3 mL Intravenous Q12H    OBJECTIVE  Filed Vitals:   10/04/12 1647 10/04/12 2057 10/05/12 0405 10/05/12 0406  BP: 100/65 105/51  118/69  Pulse:  118  108  Temp:  97.9 F (36.6 C)  98.5 F (36.9 C)  TempSrc:  Oral    Resp:  19  20  Height:      Weight:   291 lb (131.997 kg)   SpO2:  97%  95%    Intake/Output Summary (Last 24 hours) at 10/05/12 0826 Last data filed at 10/05/12 0700  Gross per 24 hour  Intake 1182.66 ml  Output   2500 ml  Net -1317.34 ml   Filed Weights   10/03/12 2007 10/04/12 0206 10/05/12 0405  Weight: 293 lb (132.904 kg) 290 lb 8 oz (131.77 kg) 291 lb (131.997 kg)    PHYSICAL EXAM  General: Pleasant, NAD.  Moderately obese.   Neuro: Alert and oriented X 3. Moves all extremities spontaneously. Psych: Normal affect. HEENT:  Normal  Neck: Supple without bruits or JVD. Lungs:  Resp regular and unlabored, CTA. Heart: irregular rhythm with distant HS.   Abdomen: Soft, non-tender, non-distended, BS + x 4.  Extremities: No clubbing, cyanosis or edema. DP/PT/Radials 2+ and equal  bilaterally.  Accessory Clinical Findings  CBC  Recent Labs  10/03/12 1910 10/04/12 0129 10/05/12 0615  WBC 11.0* 10.3 8.5  NEUTROABS  --  6.0  --   HGB 14.2 14.4 13.8  HCT 40.2 40.2 40.4  MCV 91.0 90.7 92.0  PLT 222 213 214   Basic Metabolic Panel  Recent Labs  10/03/12 1910 10/04/12 0129 10/04/12 0535  NA 135  --  134*  K 4.5  --  4.2  CL 100  --  101  CO2 24  --  23  GLUCOSE 135*  --  137*  BUN 20  --  16  CREATININE 0.93 0.84 0.86  CALCIUM 9.8  --  9.3   Liver Function Tests  Recent Labs  10/03/12 1910  AST 12  ALT 14  ALKPHOS 26*  BILITOT 0.2*  PROT 7.5  ALBUMIN 3.8   No results found for this basename: LIPASE, AMYLASE,  in the last 72 hours Cardiac Enzymes  Recent Labs  10/04/12 0130 10/04/12 0535 10/04/12 1155  TROPONINI <0.30 <0.30 <0.30   BNP No components found with this basename: POCBNP,  D-Dimer No results found for this basename: DDIMER,  in the last 72 hours Hemoglobin A1C  Recent Labs  10/04/12 0129  HGBA1C 7.1*   Fasting Lipid Panel  Recent Labs  10/04/12 0535  CHOL 190  HDL 51  LDLCALC 103*  TRIG 180*  CHOLHDL 3.7   Thyroid Function Tests  Recent Labs  10/04/12 0129  TSH 1.748    TELE  Flutter with faster rate when sitting up.   ECG  Atrial flutter.  RBBB.    Radiology/Studies  Ct Angio Chest W/cm &/or Wo Cm  10/03/2012  *RADIOLOGY REPORT*  Clinical Data: Chest pain.  Resting tachycardia.  Evaluate for pulmonary embolism.  CT ANGIOGRAPHY CHEST  Technique:  Multidetector CT imaging of the chest using the standard protocol during bolus administration of intravenous contrast. Multiplanar reconstructed images including MIPs were obtained and reviewed to evaluate the vascular anatomy.  Contrast: OMNIPAQUE IOHEXOL 350 MG/ML SOLN  Comparison: No priors.  Findings:  Mediastinum: The study is limited by extensive patient respiratory motion.  With these limitations in mind there is no central, lobar or  proximal segmental sized filling defect to suggest pulmonary embolism.  Distal segmental and subsegmental sized filling defects cannot be entirely excluded secondary to respiratory motion. Moderate cardiomegaly with right ventricular and right atrial dilatation.  The pulmonic trunk is dilated (3.4 cm in diameter), suggestive of pulmonary arterial hypertension. There is atherosclerosis of the thoracic aorta, the great vessels of the mediastinum and the coronary arteries, including calcified atherosclerotic plaque in the left main, left anterior descending and left circumflex coronary arteries. Calcifications of the aortic valve.No pathologically enlarged mediastinal or hilar lymph nodes. Esophagus is unremarkable in appearance.  Lungs/Pleura: No acute consolidative airspace disease.  No pleural effusions.  No definite suspicious appearing pulmonary nodules or masses are identified. No pleural effusions.  Upper Abdomen: Unremarkable.  Musculoskeletal: There are no aggressive appearing lytic or blastic lesions noted in the visualized portions of the skeleton.  IMPRESSION: 1.  Although the study is significantly limited by patient respiratory motion there is no evidence to suggest central, lobar or proximal segmental sized pulmonary embolism. 2.  Dilatation of the pulmonic trunk suggestive of pulmonary arterial hypertension.  There is also dilatation of the right ventricle and right atrium. 3. Atherosclerosis, including left main and two-vessel coronary artery disease. Please note that although the presence of coronary artery calcium documents the presence of coronary artery disease, the severity of this disease and any potential stenosis cannot be assessed on this non-gated CT examination.  Assessment for potential risk factor modification, dietary therapy or pharmacologic therapy may be warranted, if clinically indicated.   Original Report Authenticated By: Trudie Reed, M.D.    Dg Chest Portable 1  View  10/03/2012  *RADIOLOGY REPORT*  Clinical Data: Chest pain.  PORTABLE CHEST - 1 VIEW  Comparison: No priors.  Findings: Lung volumes are normal.  No consolidative airspace disease.  No pleural effusions.  No pneumothorax.  No evidence of pulmonary edema.  Borderline cardiomegaly.  Upper mediastinal contours are within normal limits.  Atherosclerosis of the thoracic aorta.  IMPRESSION: 1.  Borderline cardiomegaly. 2.  Atherosclerosis.   Original Report Authenticated By: Trudie Reed, M.D.     ASSESSMENT AND PLAN  1.  New onset atrial flutter with chest pain -- neg enzymes.   2.  RV enlargement, RA enlargement, Pulm HTN  -- normal echo 2008  ([place]) 3.  CAD with prior DES to the diagonal 4.  History of untreated OSA  - prescribed, not used.  For cardiac catheterization today.  Favor R and L heart given elevation.  After, if patent, will need both consult for OSA and EP consult for consideration of flutter ablation.    Signed, Shawnie Pons MD, [facility], FSCAI

## 2012-10-06 ENCOUNTER — Telehealth: Payer: Self-pay | Admitting: Cardiology

## 2012-10-06 DIAGNOSIS — Z7901 Long term (current) use of anticoagulants: Secondary | ICD-10-CM

## 2012-10-06 LAB — CBC
MCH: 31.9 pg (ref 26.0–34.0)
MCHC: 35 g/dL (ref 30.0–36.0)
MCV: 91.1 fL (ref 78.0–100.0)
Platelets: 202 10*3/uL (ref 150–400)
RDW: 13.7 % (ref 11.5–15.5)

## 2012-10-06 LAB — HEPARIN LEVEL (UNFRACTIONATED): Heparin Unfractionated: 1.84 IU/mL — ABNORMAL HIGH (ref 0.30–0.70)

## 2012-10-06 MED ORDER — LISINOPRIL 10 MG PO TABS: 10.0000 mg | ORAL_TABLET | Freq: Every evening | ORAL | Status: DC

## 2012-10-06 MED ORDER — RIVAROXABAN 20 MG PO TABS: 20.0000 mg | ORAL_TABLET | Freq: Every day | ORAL | Status: DC

## 2012-10-06 MED ORDER — METOPROLOL TARTRATE 50 MG PO TABS: 50.0000 mg | ORAL_TABLET | Freq: Every evening | ORAL | Status: DC

## 2012-10-06 NOTE — Telephone Encounter (Signed)
New Problem:    Called in needing prior auth for the patient's Rivaroxaban (XARELTO) 20 MG TABS fax: [phone].  Insurance Phone#- [phone]. [id number].

## 2012-10-06 NOTE — Progress Notes (Signed)
ELECTROPHYSIOLOGY ROUNDING NOTE    Patient Name: Jackson Parks Date of Encounter: 10-06-2012    SUBJECTIVE:Patient feels well.  No chest pain or shortness of breath. Dr Riley Kill concerned about patient being on NOAC with recent cardiac catheterization.    Plan discussed with Dr Riley Kill, will plan to discontinue NOAC for now and resume on Saturday.  Pt will then have TEE/DCCV on Monday as an elective procedure.  Following 4 weeks of anti-coagulation, plan will be for RFCA of atrial flutter with anesthesia support.   TELEMETRY: Reviewed telemetry pt in atrial flutter, v rate ~ 100 Filed Vitals:   10/05/12 1500 10/05/12 1648 10/05/12 2035 10/06/12 0533  BP: 104/76 112/63 101/71 100/79  Pulse: 106 121 96 100  Temp:   97.7 F (36.5 C) 97.6 F (36.4 C)  TempSrc:   Oral Oral  Resp:   18 18  Height:      Weight:    288 lb 12.8 oz (131 kg)  SpO2:   96% 95%   Well developed and nourished in no acute distress HENT normal Neck supple with JVP-flat Carotids brisk and full without bruits Clear Irregularly irregular rate and rhythm with controlled ventricular response, no murmurs or gallops Abd-soft with active BS without hepatomegaly No Clubbing cyanosis edema Skin-warm and dry A & Oriented  Grossly normal sensory and motor function   Intake/Output Summary (Last 24 hours) at 10/06/12 0832 Last data filed at 10/06/12 0400  Gross per 24 hour  Intake 3466.4 ml  Output   3050 ml  Net  416.4 ml    LABS: Basic Metabolic Panel:  Recent Labs  29/56/21 1910 10/04/12 0129 10/04/12 0535  NA 135  --  134*  K 4.5  --  4.2  CL 100  --  101  CO2 24  --  23  GLUCOSE 135*  --  137*  BUN 20  --  16  CREATININE 0.93 0.84 0.86  CALCIUM 9.8  --  9.3   Liver Function Tests:  Recent Labs  10/03/12 1910  AST 12  ALT 14  ALKPHOS 26*  BILITOT 0.2*  PROT 7.5  ALBUMIN 3.8   No results found for this basename: LIPASE, AMYLASE,  in the last 72 hours CBC:  Recent Labs   10/03/12 1910 10/04/12 0129 10/05/12 0615 10/06/12 0520  WBC 11.0* 10.3 8.5 8.1  NEUTROABS  --  6.0  --   --   HGB 14.2 14.4 13.8 13.3  HCT 40.2 40.2 40.4 38.0*  MCV 91.0 90.7 92.0 91.1  PLT 222 213 214 202   Cardiac Enzymes:  Recent Labs  10/04/12 0130 10/04/12 0535 10/04/12 1155  TROPONINI <0.30 <0.30 <0.30   BNP: No components found with this basename: POCBNP,  D-Dimer: No results found for this basename: DDIMER,  in the last 72 hours Hemoglobin A1C:  Recent Labs  10/04/12 0129  HGBA1C 7.1*   Fasting Lipid Panel:  Recent Labs  10/04/12 0535  CHOL 190  HDL 51  LDLCALC 103*  TRIG 180*  CHOLHDL 3.7   Thyroid Function Tests:  Recent Labs  10/04/12 0129  TSH 1.748   Radiology/Studies:  Ct Angio Chest W/cm &/or Wo Cm  10/03/2012  *RADIOLOGY REPORT*  Clinical Data: Chest pain.  Resting tachycardia.  Evaluate for pulmonary embolism.  CT ANGIOGRAPHY CHEST  Technique:  Multidetector CT imaging of the chest using the standard protocol during bolus administration of intravenous contrast. Multiplanar reconstructed images including MIPs were obtained and reviewed to evaluate the vascular  anatomy.  Contrast: OMNIPAQUE IOHEXOL 350 MG/ML SOLN  Comparison: No priors.  Findings:  Mediastinum: The study is limited by extensive patient respiratory motion.  With these limitations in mind there is no central, lobar or proximal segmental sized filling defect to suggest pulmonary embolism.  Distal segmental and subsegmental sized filling defects cannot be entirely excluded secondary to respiratory motion. Moderate cardiomegaly with right ventricular and right atrial dilatation.  The pulmonic trunk is dilated (3.4 cm in diameter), suggestive of pulmonary arterial hypertension. There is atherosclerosis of the thoracic aorta, the great vessels of the mediastinum and the coronary arteries, including calcified atherosclerotic plaque in the left main, left anterior descending and left  circumflex coronary arteries. Calcifications of the aortic valve.No pathologically enlarged mediastinal or hilar lymph nodes. Esophagus is unremarkable in appearance.  Lungs/Pleura: No acute consolidative airspace disease.  No pleural effusions.  No definite suspicious appearing pulmonary nodules or masses are identified. No pleural effusions.  Upper Abdomen: Unremarkable.  Musculoskeletal: There are no aggressive appearing lytic or blastic lesions noted in the visualized portions of the skeleton.  IMPRESSION: 1.  Although the study is significantly limited by patient respiratory motion there is no evidence to suggest central, lobar or proximal segmental sized pulmonary embolism. 2.  Dilatation of the pulmonic trunk suggestive of pulmonary arterial hypertension.  There is also dilatation of the right ventricle and right atrium. 3. Atherosclerosis, including left main and two-vessel coronary artery disease. Please note that although the presence of coronary artery calcium documents the presence of coronary artery disease, the severity of this disease and any potential stenosis cannot be assessed on this non-gated CT examination.  Assessment for potential risk factor modification, dietary therapy or pharmacologic therapy may be warranted, if clinically indicated.   Original Report Authenticated By: Trudie Reed, M.D.    Dg Chest Portable 1 View  10/03/2012  *RADIOLOGY REPORT*  Clinical Data: Chest pain.  PORTABLE CHEST - 1 VIEW  Comparison: No priors.  Findings: Lung volumes are normal.  No consolidative airspace disease.  No pleural effusions.  No pneumothorax.  No evidence of pulmonary edema.  Borderline cardiomegaly.  Upper mediastinal contours are within normal limits.  Atherosclerosis of the thoracic aorta.  IMPRESSION: 1.  Borderline cardiomegaly. 2.  Atherosclerosis.   Original Report Authenticated By: Trudie Reed, M.D.    Discharge the patient today. We will initiate anticoagulation on Friday and  bring him in for TE cardioversion on Monday. For this is for the reasons noted above. He will need rate control in the interim. We'll then anticipate outpatient catheter ablation with anesthesia support about 4 weeks following his cardioversion.

## 2012-10-06 NOTE — Progress Notes (Signed)
Pt and wife educated and informed of DC information. Pt verbalizes  Understanding of f/u appts, and appropriate meds. Pt has no further questions or concerns.

## 2012-10-06 NOTE — Telephone Encounter (Signed)
I have contacted the pt's insurance and at this time the pt's Xarelto is only authorized until 11/10/2012 ([id number]). Per the insurance the short length of authorization is due to the pt being given this medication from a hospital discharge.  I have contacted the pt's pharmacy and made them aware of this information.

## 2012-10-06 NOTE — Discharge Summary (Signed)
CARDIOLOGY DISCHARGE SUMMARY   Patient ID: Jackson Parks MRN: [id number] DOB/AGE: [birth year] 72 y.o.  Admit date: 10/03/2012 Discharge date: 10/06/2012  Primary Discharge Diagnosis:    Atrial flutter   Secondary Discharge Diagnosis:    Chest pain, mid-sternal   DM   HYPERCHOLESTEROLEMIA   CAD   Anticoagulated by anticoagulation treatment  Consults: Electrophysiology  Procedures:Right Heart Cath, Left Heart Cath, Selective Coronary Angiography, LV angiography, CT angiogram of the chest  Hospital Course: Jackson Parks is a 72 y.o. male with a history of CAD. He had chest pain that was relieved by rest and by belching. He came to the emergency room where he was in atrial flutter with a heart rate greater than 120. He was admitted for further evaluation and treatment.  He had a CT scan which ruled out PE. He was started on nitrates and aspirin. His chest pain improved and cardiac enzymes were negative. He was started on heparin for anti-coagulation and his metoprolol was increased. Cardiac catheterization was recommended to further define his anatomy. This was performed on 10/05/2012.   He had a right and left heart catheterization. Nonobstructive disease was seen and medical therapy was recommended. As his heart rate control improved on an increased dose of beta blocker, he had no further episodes of chest pain. An electrophysiology consult was called to help determine management for his atrial flutter.  Anticoagulation was recommended as his CHADS-VASc score is 3. Flutter ablation was recommended but because of his size and history of sleep apnea, anesthesia support was recommended. Because of the scheduling issues, he is to undergo a TEE-DCCV. next week. He will need to be anticoagulated prior to the procedure but a NOAC is appropriate and he is to start on Xarelto 3 days prior to the procedure.    Labs:   Lab Results  Component Value Date   WBC 8.1 10/06/2012   HGB 13.3  10/06/2012   HCT 38.0* 10/06/2012   MCV 91.1 10/06/2012   PLT 202 10/06/2012    Recent Labs Lab 10/03/12 1910  10/04/12 0535  NA 135  --  134*  K 4.5  --  4.2  CL 100  --  101  CO2 24  --  23  BUN 20  --  16  CREATININE 0.93  < > 0.86  CALCIUM 9.8  --  9.3  PROT 7.5  --   --   BILITOT 0.2*  --   --   ALKPHOS 26*  --   --   ALT 14  --   --   AST 12  --   --   GLUCOSE 135*  --  137*  < > = values in this interval not displayed.  Recent Labs  10/04/12 0130 10/04/12 0535 10/04/12 1155  TROPONINI <0.30 <0.30 <0.30   Lipid Panel     Component Value Date/Time   CHOL 190 10/04/2012 0535   TRIG 180* 10/04/2012 0535   HDL 51 10/04/2012 0535   CHOLHDL 3.7 10/04/2012 0535   VLDL 36 10/04/2012 0535   LDLCALC 103* 10/04/2012 0535    Pro B Natriuretic peptide (BNP)  Date/Time Value Range Status  10/04/2012  1:30 AM 348.0* 0 - 125 pg/mL Final    Recent Labs  10/03/12 1910  INR 1.04      Radiology: Ct Angio Chest W/cm &/or Wo Cm  10/03/2012  *RADIOLOGY REPORT*  Clinical Data: Chest pain.  Resting tachycardia.  Evaluate for pulmonary embolism.  CT ANGIOGRAPHY CHEST  Technique:  Multidetector CT imaging of the chest using the standard protocol during bolus administration of intravenous contrast. Multiplanar reconstructed images including MIPs were obtained and reviewed to evaluate the vascular anatomy.  Contrast: OMNIPAQUE IOHEXOL 350 MG/ML SOLN  Comparison: No priors.  Findings:  Mediastinum: The study is limited by extensive patient respiratory motion.  With these limitations in mind there is no central, lobar or proximal segmental sized filling defect to suggest pulmonary embolism.  Distal segmental and subsegmental sized filling defects cannot be entirely excluded secondary to respiratory motion. Moderate cardiomegaly with right ventricular and right atrial dilatation.  The pulmonic trunk is dilated (3.4 cm in diameter), suggestive of pulmonary arterial hypertension. There is  atherosclerosis of the thoracic aorta, the great vessels of the mediastinum and the coronary arteries, including calcified atherosclerotic plaque in the left main, left anterior descending and left circumflex coronary arteries. Calcifications of the aortic valve.No pathologically enlarged mediastinal or hilar lymph nodes. Esophagus is unremarkable in appearance.  Lungs/Pleura: No acute consolidative airspace disease.  No pleural effusions.  No definite suspicious appearing pulmonary nodules or masses are identified. No pleural effusions.  Upper Abdomen: Unremarkable.  Musculoskeletal: There are no aggressive appearing lytic or blastic lesions noted in the visualized portions of the skeleton.  IMPRESSION: 1.  Although the study is significantly limited by patient respiratory motion there is no evidence to suggest central, lobar or proximal segmental sized pulmonary embolism. 2.  Dilatation of the pulmonic trunk suggestive of pulmonary arterial hypertension.  There is also dilatation of the right ventricle and right atrium. 3. Atherosclerosis, including left main and two-vessel coronary artery disease. Please note that although the presence of coronary artery calcium documents the presence of coronary artery disease, the severity of this disease and any potential stenosis cannot be assessed on this non-gated CT examination.  Assessment for potential risk factor modification, dietary therapy or pharmacologic therapy may be warranted, if clinically indicated.   Original Report Authenticated By: Trudie Reed, M.D.    Dg Chest Portable 1 View  10/03/2012  *RADIOLOGY REPORT*  Clinical Data: Chest pain.  PORTABLE CHEST - 1 VIEW  Comparison: No priors.  Findings: Lung volumes are normal.  No consolidative airspace disease.  No pleural effusions.  No pneumothorax.  No evidence of pulmonary edema.  Borderline cardiomegaly.  Upper mediastinal contours are within normal limits.  Atherosclerosis of the thoracic aorta.   IMPRESSION: 1.  Borderline cardiomegaly. 2.  Atherosclerosis.   Original Report Authenticated By: Trudie Reed, M.D.     Cardiac Cath: 10/05/2012 Hemodynamics  RA 12/13 with a mean of 11 mmHg  RV 29/9 mmHg  PA 30/13 mmHg with a mean of 21 mmHg  PCWP 20/19 with a mean of 17 mmHg  LV 108/13 mmHg  AO 108/80 with a mean of 94 mmHg  Is no significant aortic or mitral valve gradient  Oxygen saturations:  PA 63%  AO 92%  Cardiac Output (Fick) 6.04 L per minute  Cardiac Index (Fick) 2.45 L per minute per meter square  Coronary angiography:  Coronary dominance: right  Left mainstem: Normal.  Left anterior descending (LAD): There is diffuse 30% narrowing of the mid LAD after the first diagonal. The first diagonal stent is widely patent. At the distal stent margin there is a focal 50% stenosis.  Left circumflex (LCx): The left circumflex is a large dominant vessel. It has mild irregularities less than 20%.  Right coronary artery (RCA): This is a small nondominant vessel and is normal.  Left ventriculography: Left  ventricular systolic function is normal, LVEF is estimated at 55-65%, there is no significant mitral regurgitation  Final Conclusions:  1. Nonobstructive coronary disease. The stent in the diagonal is widely patent.  2. Normal left ventricular function.  3. Normal right heart pressures  EKG:  05-Oct-2012 05:40:59 [facility] ROUTINE RECORD Atrial flutter with variable A-V block Right bundle branch block Abnormal ECG 70mm/s 60mm/mV 100Hz  8.0.1 12SL 241 HD CID: 1 Referred by: JULIUS MD AITSEBAOMO Unconfirmed Vent. rate 110 BPM PR interval * ms QRS duration 144 ms QT/QTc 344/465 ms P-R-T axes 235 -12 0   FOLLOW UP PLANS AND APPOINTMENTS No Known Allergies   Medication List    STOP taking these medications       lovastatin 40 MG tablet  Commonly known as:  MEVACOR     PLAVIX 75 MG tablet  Generic drug:  clopidogrel      TAKE these medications        aspirin EC 81 MG tablet  Take 81 mg by mouth daily.     Cinnamon 500 MG capsule  Take 500 mg by mouth daily.     fish oil-omega-3 fatty acids 1000 MG capsule  Take 1 g by mouth 2 (two) times daily.     glimepiride 2 MG tablet  Commonly known as:  AMARYL  Take 2 mg by mouth daily before breakfast.     ibuprofen 200 MG tablet  Commonly known as:  ADVIL,MOTRIN  Take 800 mg by mouth every 8 (eight) hours as needed for pain.     lisinopril 10 MG tablet  Commonly known as:  PRINIVIL,ZESTRIL  Take 1 tablet (10 mg total) by mouth every evening.     metoprolol 50 MG tablet  Commonly known as:  LOPRESSOR  Take 1 tablet (50 mg total) by mouth every evening.     PRESCRIPTION MEDICATION     Rivaroxaban 20 MG Tabs  Commonly known as:  XARELTO  Take 1 tablet (20 mg total) by mouth daily.  Start taking on:  10/09/2012     simvastatin 40 MG tablet  Commonly known as:  ZOCOR  Take 40 mg by mouth every evening.     sitaGLIPtan-metformin 50-1000 MG per tablet  Commonly known as:  JANUMET  Take 1 tablet by mouth 2 (two) times daily with a meal.     zolpidem 10 MG tablet  Commonly known as:  AMBIEN  Take 10 mg by mouth at bedtime as needed for sleep.        Discharge Orders   Future Appointments Provider Department Dept Phone   10/29/2012 2:15 PM Herby Abraham, MD [facility] Main Office [place]) [phone]   Future Orders Complete By Expires     Diet - low sodium heart healthy  As directed     Diet Carb Modified  As directed     Increase activity slowly  As directed       Follow-up Information   Follow up with Shawnie Pons, MD On 10/29/2012. (at 2:15 pm)    Contact information:   [address] [place] [address] [phone]       BRING ALL MEDICATIONS WITH YOU TO FOLLOW UP APPOINTMENTS  Time spent with patient to include physician time: 35 min Signed: Theodore Demark 10/06/2012, 2:17 PM Co-Sign MD

## 2012-10-07 ENCOUNTER — Encounter (HOSPITAL_COMMUNITY): Payer: Self-pay | Admitting: Pharmacy Technician

## 2012-10-12 ENCOUNTER — Encounter (HOSPITAL_COMMUNITY): Payer: Self-pay | Admitting: Anesthesiology

## 2012-10-12 ENCOUNTER — Encounter (HOSPITAL_COMMUNITY): Payer: Self-pay

## 2012-10-12 ENCOUNTER — Encounter (HOSPITAL_COMMUNITY)
Admission: RE | Disposition: A | Discharge status: Home / Self Care | Payer: Self-pay | Source: Ambulatory Visit | Attending: Cardiology

## 2012-10-12 ENCOUNTER — Other Ambulatory Visit: Payer: Self-pay

## 2012-10-12 ENCOUNTER — Ambulatory Visit (HOSPITAL_COMMUNITY): Payer: Medicare Other | Admitting: Anesthesiology

## 2012-10-12 ENCOUNTER — Ambulatory Visit (HOSPITAL_COMMUNITY)
Admission: RE | Admit: 2012-10-12 | Discharge: 2012-10-12 | Disposition: A | Discharge status: Home / Self Care | Payer: Medicare Other | Source: Ambulatory Visit | Attending: Cardiology | Admitting: Cardiology

## 2012-10-12 DIAGNOSIS — I4892 Unspecified atrial flutter: Secondary | ICD-10-CM | POA: Insufficient documentation

## 2012-10-12 DIAGNOSIS — I4891 Unspecified atrial fibrillation: Secondary | ICD-10-CM

## 2012-10-12 DIAGNOSIS — I2510 Atherosclerotic heart disease of native coronary artery without angina pectoris: Secondary | ICD-10-CM | POA: Insufficient documentation

## 2012-10-12 HISTORY — PX: TEE WITHOUT CARDIOVERSION: SHX5443

## 2012-10-12 HISTORY — PX: CARDIOVERSION: SHX1299

## 2012-10-12 LAB — GLUCOSE, CAPILLARY: Glucose-Capillary: 121 mg/dL — ABNORMAL HIGH (ref 70–99)

## 2012-10-12 SURGERY — ECHOCARDIOGRAM, TRANSESOPHAGEAL
Anesthesia: Monitor Anesthesia Care

## 2012-10-12 MED ORDER — SODIUM CHLORIDE 0.9 % IV SOLN: 250.0000 mL | INTRAVENOUS | Status: DC

## 2012-10-12 MED ORDER — BUTAMBEN-TETRACAINE-BENZOCAINE 2-2-14 % EX AERO
INHALATION_SPRAY | CUTANEOUS | Status: DC | PRN
  Administered 2012-10-12: 2 via TOPICAL

## 2012-10-12 MED ORDER — LIDOCAINE HCL (CARDIAC) 20 MG/ML IV SOLN
INTRAVENOUS | Status: DC | PRN
  Administered 2012-10-12: 50 mg via INTRAVENOUS

## 2012-10-12 MED ORDER — SODIUM CHLORIDE 0.9 % IJ SOLN: 3.0000 mL | Freq: Two times a day (BID) | INTRAMUSCULAR | Status: DC

## 2012-10-12 MED ORDER — MIDAZOLAM HCL 10 MG/2ML IJ SOLN
INTRAMUSCULAR | Status: DC | PRN
  Administered 2012-10-12 (×2): 2 mg via INTRAVENOUS

## 2012-10-12 MED ORDER — MIDAZOLAM HCL 5 MG/ML IJ SOLN
INTRAMUSCULAR | Status: AC
  Filled 2012-10-12: qty 2

## 2012-10-12 MED ORDER — PROPOFOL 10 MG/ML IV BOLUS
INTRAVENOUS | Status: DC | PRN
  Administered 2012-10-12: 60 mg via INTRAVENOUS

## 2012-10-12 MED ORDER — FENTANYL CITRATE 0.05 MG/ML IJ SOLN
INTRAMUSCULAR | Status: AC
  Filled 2012-10-12: qty 2

## 2012-10-12 MED ORDER — SODIUM CHLORIDE 0.9 % IV SOLN
INTRAVENOUS | Status: DC
  Administered 2012-10-12: 500 mL via INTRAVENOUS
  Administered 2012-10-12: 13:00:00 via INTRAVENOUS

## 2012-10-12 MED ORDER — SODIUM CHLORIDE 0.9 % IJ SOLN: 3.0000 mL | INTRAMUSCULAR | Status: DC | PRN

## 2012-10-12 MED ORDER — FENTANYL CITRATE 0.05 MG/ML IJ SOLN
INTRAMUSCULAR | Status: DC | PRN
  Administered 2012-10-12 (×2): 25 ug via INTRAVENOUS

## 2012-10-12 NOTE — Progress Notes (Signed)
Plan set up by Dr. Graciela Husbands.  See dc summary.  No orders have been put in yet.  I put these orders in and discussed with Dr. Jens Som who will review chart prior to the procedure.  TS

## 2012-10-12 NOTE — Anesthesia Postprocedure Evaluation (Signed)
  Anesthesia Post-op Note  Patient: Jackson Parks  Procedure(s) Performed: Procedure(s): TRANSESOPHAGEAL ECHOCARDIOGRAM (TEE) (N/A) CARDIOVERSION (N/A)  Patient Location: Endoscopy Unit  Anesthesia Type:MAC  Level of Consciousness: awake, alert  and oriented  Airway and Oxygen Therapy: Patient Spontanous Breathing and Patient connected to nasal cannula oxygen  Post-op Pain: none  Post-op Assessment: Post-op Vital signs reviewed, Patient's Cardiovascular Status Stable and Respiratory Function Stable  Post-op Vital Signs: Reviewed and stable  Complications: No apparent anesthesia complications

## 2012-10-12 NOTE — Anesthesia Preprocedure Evaluation (Addendum)
Anesthesia Evaluation  Patient identified by MRN, date of birth, ID band Patient awake    Reviewed: Allergy & Precautions, H&P , NPO status , Patient's Chart, lab work & pertinent test results, reviewed documented beta blocker date and time   Airway Mallampati: II TM Distance: >3 FB Neck ROM: Full    Dental no notable dental hx. (+) Teeth Intact and Dental Advisory Given   Pulmonary sleep apnea , COPD breath sounds clear to auscultation  Pulmonary exam normal       Cardiovascular + CAD + dysrhythmias Atrial Fibrillation Rhythm:Irregular Rate:Normal     Neuro/Psych negative neurological ROS  negative psych ROS   GI/Hepatic negative GI ROS, Neg liver ROS,   Endo/Other  neg diabetesMorbid obesity  Renal/GU negative Renal ROS  negative genitourinary   Musculoskeletal   Abdominal   Peds  Hematology negative hematology ROS (+)   Anesthesia Other Findings   Reproductive/Obstetrics negative OB ROS                        Anesthesia Physical Anesthesia Plan  ASA: III  Anesthesia Plan: General   Post-op Pain Management:    Induction: Intravenous  Airway Management Planned: Mask  Additional Equipment:   Intra-op Plan:   Post-operative Plan:   Informed Consent: I have reviewed the patients History and Physical, chart, labs and discussed the procedure including the risks, benefits and alternatives for the proposed anesthesia with the patient or authorized representative who has indicated his/her understanding and acceptance.   Dental advisory given  Plan Discussed with: Anesthesiologist and CRNA  Anesthesia Plan Comments:        Anesthesia Quick Evaluation

## 2012-10-12 NOTE — CV Procedure (Signed)
See results of TEE in [employer]; no LAA thrombus identified; patient subsequently sedated by anesthesia with diprovan 60 mg and lidocaine 50 mg IV; synchronized DCCV with 120 joules resulted in sinus rhythm; no immediate complications; continue xeralto as prescribed; fu Dr Graciela Husbands and Dr Riley Kill. Olga Millers

## 2012-10-12 NOTE — Preoperative (Signed)
Beta Blockers   Reason not to administer Beta Blockers: not prescribed 

## 2012-10-12 NOTE — Anesthesia Postprocedure Evaluation (Signed)
  Anesthesia Post-op Note  Patient: Jackson Parks  Procedure(s) Performed: Procedure(s): TRANSESOPHAGEAL ECHOCARDIOGRAM (TEE) (N/A) CARDIOVERSION (N/A)  Patient Location: Endo  Anesthesia Type:General  Level of Consciousness: awake  Airway and Oxygen Therapy: Patient Spontanous Breathing and Patient connected to nasal cannula oxygen  Post-op Pain: none  Post-op Assessment: Post-op Vital signs reviewed, Patient's Cardiovascular Status Stable, Respiratory Function Stable, Patent Airway and No signs of Nausea or vomiting  Post-op Vital Signs: Reviewed and stable  Complications: No apparent anesthesia complications

## 2012-10-12 NOTE — Transfer of Care (Signed)
Immediate Anesthesia Transfer of Care Note  Patient: Jackson Parks  Procedure(s) Performed: Procedure(s): TRANSESOPHAGEAL ECHOCARDIOGRAM (TEE) (N/A) CARDIOVERSION (N/A)  Patient Location: Endoscopy Unit  Anesthesia Type:MAC  Level of Consciousness: awake, alert  and oriented  Airway & Oxygen Therapy: Patient Spontanous Breathing and Patient connected to nasal cannula oxygen  Post-op Assessment: Report given to PACU RN and Post -op Vital signs reviewed and stable  Post vital signs: Reviewed and stable  Complications: No apparent anesthesia complications

## 2012-10-12 NOTE — Interval H&P Note (Signed)
History and Physical Interval Note:  10/12/2012 12:59 PM  Jackson Parks  has presented today for surgery, with the diagnosis of atrial flutter  The various methods of treatment have been discussed with the patient and family. After consideration of risks, benefits and other options for treatment, the patient has consented to  Procedure(s): TRANSESOPHAGEAL ECHOCARDIOGRAM (TEE) (N/A) CARDIOVERSION (N/A) as a surgical intervention .  The patient's history has been reviewed, patient examined, no change in status, stable for surgery.  I have reviewed the patient's chart and labs.  Questions were answered to the patient's satisfaction.     Olga Millers

## 2012-10-12 NOTE — H&P (Signed)
Darrol Jump, PA PHYSICIAN ASSISTANT CERTIFIED Signed Cardiology Discharge Summaries Service date: 10/06/2012 1:55 PM  Related encounter: Admission (Discharged) from 10/03/2012 in [facility] CARDIAC UNIT      CARDIOLOGY DISCHARGE SUMMARY      Patient ID: Jackson Parks MRN: [id number] DOB/AGE: 1940-12-03 72 y.o.   Admit date: 10/03/2012 Discharge date: 10/06/2012   Primary Discharge Diagnosis:    Atrial flutter    Secondary Discharge Diagnosis:     Chest pain, mid-sternal   DM   HYPERCHOLESTEROLEMIA   CAD   Anticoagulated by anticoagulation treatment   Consults: Electrophysiology   Procedures:Right Heart Cath, Left Heart Cath, Selective Coronary Angiography, LV angiography, CT angiogram of the chest   Hospital Course: Jackson Parks is a 72 y.o. male with a history of CAD. He had chest pain that was relieved by rest and by belching. He came to the emergency room where he was in atrial flutter with a heart rate greater than 120. He was admitted for further evaluation and treatment.   He had a CT scan which ruled out PE. He was started on nitrates and aspirin. His chest pain improved and cardiac enzymes were negative. He was started on heparin for anti-coagulation and his metoprolol was increased. Cardiac catheterization was recommended to further define his anatomy. This was performed on 10/05/2012.    He had a right and left heart catheterization. Nonobstructive disease was seen and medical therapy was recommended. As his heart rate control improved on an increased dose of beta blocker, he had no further episodes of chest pain. An electrophysiology consult was called to help determine management for his atrial flutter.   Anticoagulation was recommended as his CHADS-VASc score is 3. Flutter ablation was recommended but because of his size and history of sleep apnea, anesthesia support was recommended. Because of the scheduling issues, he is to  undergo a TEE-DCCV. next week. He will need to be anticoagulated prior to the procedure but a NOAC is appropriate and he is to start on Xarelto 3 days prior to the procedure.       Labs:    Lab Results   Component  Value  Date     WBC  8.1  10/06/2012     HGB  13.3  10/06/2012     HCT  38.0*  10/06/2012     MCV  91.1  10/06/2012     PLT  202  10/06/2012    Recent Labs Lab  10/03/12 1910    10/04/12 0535   NA  135   --   134*   K  4.5   --   4.2   CL  100   --   101   CO2  24   --   23   BUN  20   --   16   CREATININE  0.93   < >  0.86   CALCIUM  9.8   --   9.3   PROT  7.5   --    --    BILITOT  0.2*   --    --    ALKPHOS  26*   --    --    ALT  14   --    --    AST  12   --    --    GLUCOSE  135*   --   137*    < > = values in this interval not displayed. Recent Labs  10/04/12 0130  10/04/12 0535  10/04/12 1155   TROPONINI  <0.30  <0.30  <0.30    Lipid Panel      Component  Value  Date/Time     CHOL  190  10/04/2012 0535     TRIG  180*  10/04/2012 0535     HDL  51  10/04/2012 0535     CHOLHDL  3.7  10/04/2012 0535     VLDL  36  10/04/2012 0535     LDLCALC  103*  10/04/2012 0535       Pro B Natriuretic peptide (BNP)   Date/Time  Value  Range  Status   10/04/2012  1:30 AM  348.0*  0 - 125 pg/mL  Final    Recent Labs   10/03/12 1910   INR  1.04       Radiology: Ct Angio Chest W/cm &/or Wo Cm   10/03/2012  *RADIOLOGY REPORT*  Clinical Data: Chest pain.  Resting tachycardia.  Evaluate for pulmonary embolism.  CT ANGIOGRAPHY CHEST  Technique:  Multidetector CT imaging of the chest using the standard protocol during bolus administration of intravenous contrast. Multiplanar reconstructed images including MIPs were obtained and reviewed to evaluate the vascular anatomy.  Contrast: OMNIPAQUE IOHEXOL 350 MG/ML SOLN  Comparison: No priors.  Findings:  Mediastinum: The study is limited by extensive patient respiratory motion.  With these limitations in mind there is no  central, lobar or proximal segmental sized filling defect to suggest pulmonary embolism.  Distal segmental and subsegmental sized filling defects cannot be entirely excluded secondary to respiratory motion. Moderate cardiomegaly with right ventricular and right atrial dilatation.  The pulmonic trunk is dilated (3.4 cm in diameter), suggestive of pulmonary arterial hypertension. There is atherosclerosis of the thoracic aorta, the great vessels of the mediastinum and the coronary arteries, including calcified atherosclerotic plaque in the left main, left anterior descending and left circumflex coronary arteries. Calcifications of the aortic valve.No pathologically enlarged mediastinal or hilar lymph nodes. Esophagus is unremarkable in appearance.  Lungs/Pleura: No acute consolidative airspace disease.  No pleural effusions.  No definite suspicious appearing pulmonary nodules or masses are identified. No pleural effusions.  Upper Abdomen: Unremarkable.  Musculoskeletal: There are no aggressive appearing lytic or blastic lesions noted in the visualized portions of the skeleton.  IMPRESSION: 1.  Although the study is significantly limited by patient respiratory motion there is no evidence to suggest central, lobar or proximal segmental sized pulmonary embolism. 2.  Dilatation of the pulmonic trunk suggestive of pulmonary arterial hypertension.  There is also dilatation of the right ventricle and right atrium. 3. Atherosclerosis, including left main and two-vessel coronary artery disease. Please note that although the presence of coronary artery calcium documents the presence of coronary artery disease, the severity of this disease and any potential stenosis cannot be assessed on this non-gated CT examination.  Assessment for potential risk factor modification, dietary therapy or pharmacologic therapy may be warranted, if clinically indicated.   Original Report Authenticated By: Trudie Reed, M.D.     Dg Chest  Portable 1 View   10/03/2012  *RADIOLOGY REPORT*  Clinical Data: Chest pain.  PORTABLE CHEST - 1 VIEW  Comparison: No priors.  Findings: Lung volumes are normal.  No consolidative airspace disease.  No pleural effusions.  No pneumothorax.  No evidence of pulmonary edema.  Borderline cardiomegaly.  Upper mediastinal contours are within normal limits.  Atherosclerosis of the thoracic aorta.  IMPRESSION: 1.  Borderline cardiomegaly. 2.  Atherosclerosis.  Original Report Authenticated By: Trudie Reed, M.D.       Cardiac Cath: 10/05/2012 Hemodynamics   RA 12/13 with a mean of 11 mmHg   RV 29/9 mmHg   PA 30/13 mmHg with a mean of 21 mmHg   PCWP 20/19 with a mean of 17 mmHg   LV 108/13 mmHg   AO 108/80 with a mean of 94 mmHg   Is no significant aortic or mitral valve gradient   Oxygen saturations:   PA 63%   AO 92%   Cardiac Output (Fick) 6.04 L per minute   Cardiac Index (Fick) 2.45 L per minute per meter square   Coronary angiography:   Coronary dominance: right   Left mainstem: Normal.   Left anterior descending (LAD): There is diffuse 30% narrowing of the mid LAD after the first diagonal. The first diagonal stent is widely patent. At the distal stent margin there is a focal 50% stenosis.   Left circumflex (LCx): The left circumflex is a large dominant vessel. It has mild irregularities less than 20%.   Right coronary artery (RCA): This is a small nondominant vessel and is normal.   Left ventriculography: Left ventricular systolic function is normal, LVEF is estimated at 55-65%, there is no significant mitral regurgitation   Final Conclusions:   1. Nonobstructive coronary disease. The stent in the diagonal is widely patent.   2. Normal left ventricular function.   3. Normal right heart pressures   EKG:  05-Oct-2012 05:40:59 [facility]-20 ROUTINE RECORD Atrial flutter with variable A-V block Right bundle branch block Abnormal ECG 62mm/s 42mm/mV 100Hz  8.0.1 12SL  241 HD CID: 1 Referred by: JULIUS MD AITSEBAOMO Unconfirmed Vent. rate 110 BPM PR interval * ms QRS duration 144 ms QT/QTc 344/465 ms P-R-T axes 235 -12 0     FOLLOW UP PLANS AND APPOINTMENTS No Known Allergies    Medication List      STOP taking these medications           lovastatin 40 MG tablet   Commonly known as:  MEVACOR         PLAVIX 75 MG tablet   Generic drug:  clopidogrel          TAKE these medications           aspirin EC 81 MG tablet   Take 81 mg by mouth daily.         Cinnamon 500 MG capsule   Take 500 mg by mouth daily.         fish oil-omega-3 fatty acids 1000 MG capsule   Take 1 g by mouth 2 (two) times daily.         glimepiride 2 MG tablet   Commonly known as:  AMARYL   Take 2 mg by mouth daily before breakfast.         ibuprofen 200 MG tablet   Commonly known as:  ADVIL,MOTRIN   Take 800 mg by mouth every 8 (eight) hours as needed for pain.         lisinopril 10 MG tablet   Commonly known as:  PRINIVIL,ZESTRIL   Take 1 tablet (10 mg total) by mouth every evening.         metoprolol 50 MG tablet   Commonly known as:  LOPRESSOR   Take 1 tablet (50 mg total) by mouth every evening.         PRESCRIPTION MEDICATION         Rivaroxaban  20 MG Tabs   Commonly known as:  XARELTO   Take 1 tablet (20 mg total) by mouth daily.   Start taking on:  10/09/2012         simvastatin 40 MG tablet   Commonly known as:  ZOCOR   Take 40 mg by mouth every evening.         sitaGLIPtan-metformin 50-1000 MG per tablet   Commonly known as:  JANUMET   Take 1 tablet by mouth 2 (two) times daily with a meal.         zolpidem 10 MG tablet   Commonly known as:  AMBIEN   Take 10 mg by mouth at bedtime as needed for sleep.              Discharge Orders     Future Appointments  Provider  Department  Dept Phone     10/29/2012 2:15 PM  Herby Abraham, MD  [facility] Main Office [place])  [phone]     Future Orders  Complete By   Expires        Diet - low sodium heart healthy   As directed         Diet Carb Modified   As directed         Increase activity slowly   As directed           Follow-up Information     Follow up with Shawnie Pons, MD On 10/29/2012. (at 2:15 pm)      Contact information:     [address] [place] [address] [phone]          BRING ALL MEDICATIONS WITH YOU TO FOLLOW UP APPOINTMENTS   Time spent with patient to include physician time: 35 min Signed: Theodore Demark 10/06/2012, 2:17 PM Co-Sign MD   For TEE DCCV; patient has been on xeralto since 10/09/12. No changes Olga Millers 12:56 PM

## 2012-10-12 NOTE — Progress Notes (Signed)
  Echocardiogram 2D Echocardiogram has been performed.  CHUNG, KWONG 10/12/2012, 1:28 PM

## 2012-10-13 ENCOUNTER — Encounter (HOSPITAL_COMMUNITY): Payer: Self-pay | Admitting: Cardiology

## 2012-10-17 ENCOUNTER — Telehealth: Payer: Self-pay | Admitting: Cardiology

## 2012-10-17 NOTE — Telephone Encounter (Signed)
Called to check on status.  He is doing very well.

## 2012-10-17 NOTE — Telephone Encounter (Signed)
Called to check on status.  He is doing well.  Taking Xarelto.  No bleeding.  Scheduled for follow up on March 20.

## 2012-10-29 ENCOUNTER — Encounter: Payer: Self-pay | Admitting: Cardiology

## 2012-10-29 ENCOUNTER — Ambulatory Visit (INDEPENDENT_AMBULATORY_CARE_PROVIDER_SITE_OTHER): Payer: Medicare Other | Admitting: Cardiology

## 2012-10-29 VITALS — BP 116/64 | HR 71 | Ht 71.0 in | Wt 291.8 lb

## 2012-10-29 NOTE — Patient Instructions (Addendum)
You have been referred to Dr Graciela Husbands to discuss Atrial Flutter Ablation.  Your physician recommends that you schedule a follow-up appointment in: 3 MONTHS with Dr Swaz[place]and (previous pt of Dr Riley Kill)  Your physician recommends that you continue on your current medications as directed. Please refer to the Current Medication list given to you today.

## 2012-10-29 NOTE — Progress Notes (Signed)
HPI:  Is doing very well. He's not having any ongoing chest pain. He presented with atrial flutter, and underwent cardiac catheterization which demonstrated continued patency of the stent. He then underwent cardioversion in a TEE guided fashion. He's currently on Xarelto. We are arranging to have him see Dr. Graciela Husbands in followup for consideration of ablation therapy to prevent need for long-term anticoagulation. I went over this in detail with the patient today. He's not having any ongoing chest pain.  We did also discuss the likelihood of OSA, which his wife endorses.  She thinks this needs further discussion since he has it and it is untreated.  Patient endorses readdressing the issue.  He understands the relationship with his current problem.      Current Outpatient Prescriptions  Medication Sig Dispense Refill  . aspirin EC 81 MG tablet Take 81 mg by mouth daily.      . fish oil-omega-3 fatty acids 1000 MG capsule Take 1 g by mouth 2 (two) times daily.       Marland Kitchen glimepiride (AMARYL) 2 MG tablet Take 2 mg by mouth daily before breakfast.      . lisinopril (PRINIVIL,ZESTRIL) 10 MG tablet Take 1 tablet (10 mg total) by mouth every evening.  30 tablet  11  . metoprolol (LOPRESSOR) 50 MG tablet Take 1 tablet (50 mg total) by mouth every evening.  60 tablet  11  . Rivaroxaban (XARELTO) 20 MG TABS Take 1 tablet (20 mg total) by mouth daily.  30 tablet  11  . simvastatin (ZOCOR) 40 MG tablet Take 40 mg by mouth every evening.      . sitaGLIPtan-metformin (JANUMET) 50-1000 MG per tablet Take 1 tablet by mouth 2 (two) times daily with a meal.      . zolpidem (AMBIEN) 10 MG tablet Take 10 mg by mouth at bedtime as needed for sleep.       No current facility-administered medications for this visit.    No Known Allergies  Past Medical History  Diagnosis Date  . CAD (coronary artery disease)   . Hypercholesterolemia   . DM (dermatomyositis)   . Obesity   . COPD (chronic obstructive pulmonary disease)       Past Surgical History  Procedure Laterality Date  . Plantar fasciectomy  2005  . Skin lesions      multiple removed  . Steting      stenting of the diagonal branch using a 2.5 x 23 Cypher drug-eluting stent followed by post dilatation  . Tee without cardioversion N/A 10/12/2012    Procedure: TRANSESOPHAGEAL ECHOCARDIOGRAM (TEE);  Surgeon: Lewayne Bunting, MD;  Location: [facility] ENDOSCOPY;  Service: Cardiovascular;  Laterality: N/A;  . Cardioversion N/A 10/12/2012    Procedure: CARDIOVERSION;  Surgeon: Lewayne Bunting, MD;  Location: [facility] ENDOSCOPY;  Service: Cardiovascular;  Laterality: N/A;    Family History  Problem Relation Age of Onset  . Heart attack Father 65    deceased  . Heart failure Mother 34    deceased  . Coronary artery disease Brother 68  . Peripheral vascular disease Brother 36    History   Social History  . Marital Status: Married    Spouse Name: N/A    Number of Children: 2  . Years of Education: N/A   Occupational History  . truck driver    Social History Main Topics  . Smoking status: Former Smoker    Quit date: 12/23/2000  . Smokeless tobacco: Not on file  . Alcohol  Use: Yes     Comment: two margaritas per year  . Drug Use: No  . Sexually Active: Not on file   Other Topics Concern  . Not on file   Social History Narrative  . No narrative on file    ROS: Please see the HPI.  All other systems reviewed and negative.  PHYSICAL EXAM:  BP 116/64  Pulse 71  Ht 5\' 11"  (1.803 m)  Wt 291 lb 12.8 oz (132.36 kg)  BMI 40.72 kg/m2  General: Well developed, well nourished, in no acute distress. Head:  Normocephalic and atraumatic. Neck: no JVD Lungs: Clear to auscultation and percussion. Heart: Normal S1 and S2.  No murmur, rubs or gallops.  Pulses: Pulses normal in all 4 extremities. Extremities: No clubbing or cyanosis. No edema. Neurologic: Alert and oriented x 3.  EKG:  NSR.  RBBB.   ASSESSMENT AND PLAN:  1.  FU Dr. Graciela Husbands. 2.  FU Dr.  [place].  3.  Continue medical therapy.   4.  Lipid fu at the [facility].

## 2012-11-01 NOTE — Assessment & Plan Note (Addendum)
No current symptoms at present.  Doing well.  Recent cath demonstrated a widely patent stent.  Remains on ASA alone.  Also on Rivaroxaban for atrial arrhythmia.

## 2012-11-01 NOTE — Assessment & Plan Note (Signed)
Followed at the [facility] clinic.

## 2012-11-01 NOTE — Assessment & Plan Note (Signed)
Recent TEE guided cardioversion and doing well.  Needs follow up with EP with consideration of ablation and possibly dc anticoagulation down the road post ablation if he accepts.

## 2012-11-03 ENCOUNTER — Encounter: Payer: Self-pay | Admitting: Internal Medicine

## 2012-11-03 ENCOUNTER — Ambulatory Visit (INDEPENDENT_AMBULATORY_CARE_PROVIDER_SITE_OTHER): Payer: Medicare Other | Admitting: Internal Medicine

## 2012-11-03 ENCOUNTER — Encounter: Payer: Self-pay | Admitting: *Deleted

## 2012-11-03 VITALS — BP 122/80 | HR 63 | Ht 71.0 in | Wt 294.0 lb

## 2012-11-03 DIAGNOSIS — I4892 Unspecified atrial flutter: Secondary | ICD-10-CM

## 2012-11-03 NOTE — Progress Notes (Signed)
kf Patient Care Team: Pcp Not In System as PCP - General   HPI  Jackson Parks is a 71 y.o. male Seen in followup for atrial flutter during hospitalization 2/14. Catheterization demonstrated nonobstructive coronary disease and he subsequently underwent TEE guided cardioversion . This demonstrated ejection fraction 50-55% with mild left atrial enlargement  He has been treated with Rivaroxaban.    he feels better following cardioversion Past Medical History  Diagnosis Date  . CAD (coronary artery disease)   . Hypercholesterolemia   . DM (dermatomyositis)   . Obesity   . COPD (chronic obstructive pulmonary disease)     Past Surgical History  Procedure Laterality Date  . Plantar fasciectomy  2005  . Skin lesions      multiple removed  . Steting      stenting of the diagonal branch using a 2.5 x 23 Cypher drug-eluting stent followed by post dilatation  . Tee without cardioversion N/A 10/12/2012    Procedure: TRANSESOPHAGEAL ECHOCARDIOGRAM (TEE);  Surgeon: Brian S Crenshaw, MD;  Location: [facility] ENDOSCOPY;  Service: Cardiovascular;  Laterality: N/A;  . Cardioversion N/A 10/12/2012    Procedure: CARDIOVERSION;  Surgeon: Brian S Crenshaw, MD;  Location: [facility] ENDOSCOPY;  Service: Cardiovascular;  Laterality: N/A;    Current Outpatient Prescriptions  Medication Sig Dispense Refill  . aspirin EC 81 MG tablet Take 81 mg by mouth daily.      . fish oil-omega-3 fatty acids 1000 MG capsule Take 1 g by mouth 2 (two) times daily.       . glimepiride (AMARYL) 2 MG tablet Take 2 mg by mouth daily before breakfast.      . lisinopril (PRINIVIL,ZESTRIL) 10 MG tablet Take 1 tablet (10 mg total) by mouth every evening.  30 tablet  11  . metoprolol (LOPRESSOR) 50 MG tablet Take 1 tablet (50 mg total) by mouth every evening.  60 tablet  11  . Rivaroxaban (XARELTO) 20 MG TABS Take 1 tablet (20 mg total) by mouth daily.  30 tablet  11  . simvastatin (ZOCOR) 40 MG tablet Take 40 mg by mouth every evening.       . sitaGLIPtan-metformin (JANUMET) 50-1000 MG per tablet Take 1 tablet by mouth 2 (two) times daily with a meal.      . zolpidem (AMBIEN) 10 MG tablet Take 10 mg by mouth at bedtime as needed for sleep.       No current facility-administered medications for this visit.    No Known Allergies  Review of Systems negative except from HPI and PMH  Physical Exam BP 122/80  Pulse 63  Ht 5' 11" (1.803 m)  Wt 294 lb (133.358 kg)  BMI 41.02 kg/m2 Well developed and morbidly obese in no acute distress HENT normal E scleral and icterus clear Neck Supple JVP flat; carotids brisk and full Clear to ausculation  Regular rate and rhythm, no murmurs gallops or rub Soft with active bowel sounds No clubbing cyanosis none Edema Alert and oriented, grossly normal motor and sensory function Skin Warm and Dry  1ECG demonstrates sinus rhythm at 63 Intervals 19/16/43 Axis -20 Right bundle branch block  Assessment and  Plan  

## 2012-11-03 NOTE — Patient Instructions (Signed)
Your physician has recommended that you have an atrial flutter ablation. Catheter ablation is a medical procedure used to treat some cardiac arrhythmias (irregular heartbeats). During catheter ablation, a long, thin, flexible tube is put into a blood vessel in your groin (upper thigh), or neck. This tube is called an ablation catheter. It is then guided to your heart through the blood vessel. Radio frequency waves destroy small areas of heart tissue where abnormal heartbeats may cause an arrhythmia to start. Please see the instruction sheet given to you today. - we will be in touch with you about scheduling this but we are most likely looking at 11/26/12 with Dr. Graciela Husbands.

## 2012-11-03 NOTE — Assessment & Plan Note (Signed)
We have discussed proceeding with catheter ablation of his atrial flutter substrate. We reviewed concerns regarding risks and benefits. Given his size, undertaking with a support of  Anesthesia Also continue him on his Rivaroxaban until his procedure date

## 2012-11-10 ENCOUNTER — Encounter: Payer: Self-pay | Admitting: *Deleted

## 2012-11-10 ENCOUNTER — Other Ambulatory Visit: Payer: Self-pay | Admitting: *Deleted

## 2012-11-10 DIAGNOSIS — I4892 Unspecified atrial flutter: Secondary | ICD-10-CM

## 2012-11-13 ENCOUNTER — Telehealth: Payer: Self-pay | Admitting: Cardiology

## 2012-11-13 NOTE — Telephone Encounter (Signed)
New Prob   Calling in regards to prior authorization for Xarelto. Would like to speak to nurse.

## 2012-11-16 ENCOUNTER — Encounter (HOSPITAL_COMMUNITY): Payer: Self-pay | Admitting: Pharmacy Technician

## 2012-11-16 NOTE — Telephone Encounter (Signed)
Spoke to [employer] for approval for [employer].Prescription pending.[facility] will notify patient when xarelto approved.

## 2012-11-19 ENCOUNTER — Telehealth: Payer: Self-pay | Admitting: Cardiology

## 2012-11-19 ENCOUNTER — Other Ambulatory Visit (INDEPENDENT_AMBULATORY_CARE_PROVIDER_SITE_OTHER): Payer: Medicare Other

## 2012-11-19 DIAGNOSIS — I4892 Unspecified atrial flutter: Secondary | ICD-10-CM

## 2012-11-19 LAB — BASIC METABOLIC PANEL
BUN: 14 mg/dL (ref 6–23)
CO2: 25 mEq/L (ref 19–32)
Calcium: 9.2 mg/dL (ref 8.4–10.5)
GFR: 93.6 mL/min (ref >60.00)
Glucose, Bld: 127 mg/dL — ABNORMAL HIGH (ref 70–99)
Potassium: 4.4 mEq/L (ref 3.5–5.1)
Sodium: 135 mEq/L (ref 135–145)

## 2012-11-19 LAB — CBC WITH DIFFERENTIAL/PLATELET
Basophils Absolute: 0 10*3/uL (ref 0.0–0.1)
Eosinophils Absolute: 0.1 10*3/uL (ref 0.0–0.7)
HCT: 41.5 % (ref 39.0–52.0)
Hemoglobin: 14 g/dL (ref 13.0–17.0)
Lymphs Abs: 1.9 10*3/uL (ref 0.7–4.0)
MCHC: 33.7 g/dL (ref 30.0–36.0)
Monocytes Absolute: 0.7 10*3/uL (ref 0.1–1.0)
Monocytes Relative: 8.4 % (ref 3.0–12.0)
Neutro Abs: 6 10*3/uL (ref 1.4–7.7)
Platelets: 224 10*3/uL (ref 150.0–400.0)
RDW: 13.8 % (ref 11.5–14.6)

## 2012-11-19 NOTE — Telephone Encounter (Signed)
Spoke to Jackson Parks at [facility] she stated appeal for approving Xarelto is pending.

## 2012-11-19 NOTE — Telephone Encounter (Signed)
New problem    Urgent to return call because pts med request has been denied

## 2012-11-20 ENCOUNTER — Telehealth: Payer: Self-pay | Admitting: *Deleted

## 2012-11-20 ENCOUNTER — Telehealth: Payer: Self-pay | Admitting: Cardiology

## 2012-11-20 NOTE — Telephone Encounter (Signed)
pharmacy calling to get status on pt Xarelto Prior Auth. Informed her that according to our notes on file that this medication is pending as of 11-19-12

## 2012-11-20 NOTE — Telephone Encounter (Signed)
New Problem:    Called in wanting to let our office know that the patient's Rivaroxaban (XARELTO) 20 MG TABS has been approved.

## 2012-11-23 MED ORDER — RIVAROXABAN 20 MG PO TABS: 20.0000 mg | ORAL_TABLET | Freq: Every day | ORAL | Status: DC

## 2012-11-23 NOTE — Telephone Encounter (Signed)
Patient called no answer.Left message on personal voice mail Xarelto has been approved.Reflls sent to pharmacy.

## 2012-11-26 ENCOUNTER — Ambulatory Visit (HOSPITAL_COMMUNITY): Payer: Medicare Other | Admitting: Certified Registered"

## 2012-11-26 ENCOUNTER — Encounter (HOSPITAL_COMMUNITY): Payer: Self-pay | Admitting: Certified Registered"

## 2012-11-26 ENCOUNTER — Encounter (HOSPITAL_COMMUNITY)
Admission: RE | Disposition: A | Discharge status: Home / Self Care | Payer: Self-pay | Source: Ambulatory Visit | Attending: Internal Medicine

## 2012-11-26 ENCOUNTER — Ambulatory Visit (HOSPITAL_COMMUNITY)
Admission: RE | Admit: 2012-11-26 | Discharge: 2012-11-27 | Disposition: A | Discharge status: Home / Self Care | Payer: Medicare Other | Source: Ambulatory Visit | Attending: Internal Medicine | Admitting: Internal Medicine

## 2012-11-26 DIAGNOSIS — Z9861 Coronary angioplasty status: Secondary | ICD-10-CM | POA: Insufficient documentation

## 2012-11-26 DIAGNOSIS — J449 Chronic obstructive pulmonary disease, unspecified: Secondary | ICD-10-CM | POA: Insufficient documentation

## 2012-11-26 DIAGNOSIS — Z6841 Body Mass Index (BMI) 40.0 and over, adult: Secondary | ICD-10-CM | POA: Insufficient documentation

## 2012-11-26 DIAGNOSIS — I4892 Unspecified atrial flutter: Secondary | ICD-10-CM

## 2012-11-26 DIAGNOSIS — I2510 Atherosclerotic heart disease of native coronary artery without angina pectoris: Secondary | ICD-10-CM | POA: Insufficient documentation

## 2012-11-26 DIAGNOSIS — E669 Obesity, unspecified: Secondary | ICD-10-CM | POA: Insufficient documentation

## 2012-11-26 DIAGNOSIS — J4489 Other specified chronic obstructive pulmonary disease: Secondary | ICD-10-CM | POA: Insufficient documentation

## 2012-11-26 DIAGNOSIS — E7800 Pure hypercholesterolemia, unspecified: Secondary | ICD-10-CM | POA: Insufficient documentation

## 2012-11-26 HISTORY — PX: A FLUTTER ABLATION: SHX5348

## 2012-11-26 LAB — GLUCOSE, CAPILLARY: Glucose-Capillary: 90 mg/dL (ref 70–99)

## 2012-11-26 SURGERY — ABLATION, ARRHYTHMOGENIC FOCUS, FOR ATRIAL FLUTTER
Anesthesia: General | Wound class: Clean

## 2012-11-26 MED ORDER — GLIMEPIRIDE 2 MG PO TABS
2.0000 mg | ORAL_TABLET | Freq: Every day | ORAL | Status: DC
  Administered 2012-11-27: 2 mg via ORAL
  Filled 2012-11-26 (×2): qty 1

## 2012-11-26 MED ORDER — ONDANSETRON HCL 4 MG/2ML IJ SOLN: 4.0000 mg | Freq: Four times a day (QID) | INTRAMUSCULAR | Status: DC | PRN

## 2012-11-26 MED ORDER — SITAGLIPTIN PHOS-METFORMIN HCL 50-1000 MG PO TABS: 1.0000 | ORAL_TABLET | Freq: Two times a day (BID) | ORAL | Status: DC

## 2012-11-26 MED ORDER — METFORMIN HCL 500 MG PO TABS
1000.0000 mg | ORAL_TABLET | Freq: Two times a day (BID) | ORAL | Status: DC
  Administered 2012-11-27: 1000 mg via ORAL
  Filled 2012-11-26 (×3): qty 2

## 2012-11-26 MED ORDER — ZOLPIDEM TARTRATE 5 MG PO TABS: 5.0000 mg | ORAL_TABLET | Freq: Every evening | ORAL | Status: DC | PRN

## 2012-11-26 MED ORDER — NEOSTIGMINE METHYLSULFATE 1 MG/ML IJ SOLN
INTRAMUSCULAR | Status: DC | PRN
  Administered 2012-11-26: 4 mg via INTRAVENOUS

## 2012-11-26 MED ORDER — FENTANYL CITRATE 0.05 MG/ML IJ SOLN
INTRAMUSCULAR | Status: DC | PRN
  Administered 2012-11-26: 100 ug via INTRAVENOUS
  Administered 2012-11-26 (×2): 50 ug via INTRAVENOUS

## 2012-11-26 MED ORDER — LIDOCAINE HCL (CARDIAC) 20 MG/ML IV SOLN
INTRAVENOUS | Status: DC | PRN
  Administered 2012-11-26: 80 mg via INTRAVENOUS

## 2012-11-26 MED ORDER — BUPIVACAINE HCL (PF) 0.25 % IJ SOLN
INTRAMUSCULAR | Status: AC
  Filled 2012-11-26: qty 30

## 2012-11-26 MED ORDER — PROPOFOL 10 MG/ML IV BOLUS
INTRAVENOUS | Status: DC | PRN
  Administered 2012-11-26: 200 mg via INTRAVENOUS

## 2012-11-26 MED ORDER — LINAGLIPTIN 5 MG PO TABS
5.0000 mg | ORAL_TABLET | Freq: Every day | ORAL | Status: DC
  Administered 2012-11-27: 5 mg via ORAL
  Filled 2012-11-26: qty 1

## 2012-11-26 MED ORDER — SODIUM CHLORIDE 0.9 % IJ SOLN
3.0000 mL | Freq: Two times a day (BID) | INTRAMUSCULAR | Status: DC
  Administered 2012-11-26 – 2012-11-27 (×2): 3 mL via INTRAVENOUS

## 2012-11-26 MED ORDER — SODIUM CHLORIDE 0.9 % IV SOLN: 250.0000 mL | INTRAVENOUS | Status: DC | PRN

## 2012-11-26 MED ORDER — ACETAMINOPHEN 325 MG PO TABS
650.0000 mg | ORAL_TABLET | ORAL | Status: DC | PRN
  Administered 2012-11-26: 650 mg via ORAL
  Filled 2012-11-26: qty 2

## 2012-11-26 MED ORDER — METOPROLOL TARTRATE 50 MG PO TABS
50.0000 mg | ORAL_TABLET | Freq: Every evening | ORAL | Status: DC
  Administered 2012-11-26: 50 mg via ORAL
  Filled 2012-11-26 (×2): qty 1

## 2012-11-26 MED ORDER — SUCCINYLCHOLINE CHLORIDE 20 MG/ML IJ SOLN
INTRAMUSCULAR | Status: DC | PRN
  Administered 2012-11-26: 100 mg via INTRAVENOUS

## 2012-11-26 MED ORDER — ZOLPIDEM TARTRATE 5 MG PO TABS: 10.0000 mg | ORAL_TABLET | Freq: Every evening | ORAL | Status: DC | PRN

## 2012-11-26 MED ORDER — SODIUM CHLORIDE 0.9 % IJ SOLN: 3.0000 mL | INTRAMUSCULAR | Status: DC | PRN

## 2012-11-26 MED ORDER — MIDAZOLAM HCL 5 MG/5ML IJ SOLN
INTRAMUSCULAR | Status: DC | PRN
  Administered 2012-11-26: 2 mg via INTRAVENOUS

## 2012-11-26 MED ORDER — GLYCOPYRROLATE 0.2 MG/ML IJ SOLN
INTRAMUSCULAR | Status: DC | PRN
  Administered 2012-11-26: .8 mg via INTRAVENOUS

## 2012-11-26 MED ORDER — SODIUM CHLORIDE 0.9 % IV SOLN
INTRAVENOUS | Status: DC
  Administered 2012-11-26 (×2): via INTRAVENOUS

## 2012-11-26 MED ORDER — LISINOPRIL 10 MG PO TABS
10.0000 mg | ORAL_TABLET | Freq: Every evening | ORAL | Status: DC
  Administered 2012-11-26: 10 mg via ORAL
  Filled 2012-11-26 (×2): qty 1

## 2012-11-26 MED ORDER — HEPARIN (PORCINE) IN NACL 2-0.9 UNIT/ML-% IJ SOLN
INTRAMUSCULAR | Status: AC
  Filled 2012-11-26: qty 500

## 2012-11-26 MED ORDER — ROCURONIUM BROMIDE 100 MG/10ML IV SOLN
INTRAVENOUS | Status: DC | PRN
  Administered 2012-11-26: 50 mg via INTRAVENOUS

## 2012-11-26 MED ORDER — SIMVASTATIN 40 MG PO TABS
40.0000 mg | ORAL_TABLET | Freq: Every evening | ORAL | Status: DC
  Administered 2012-11-26: 40 mg via ORAL
  Filled 2012-11-26 (×2): qty 1

## 2012-11-26 MED ORDER — ONDANSETRON HCL 4 MG/2ML IJ SOLN
INTRAMUSCULAR | Status: DC | PRN
  Administered 2012-11-26: 4 mg via INTRAVENOUS

## 2012-11-26 NOTE — Anesthesia Postprocedure Evaluation (Signed)
  Anesthesia Post-op Note  Patient: Jackson Parks  Procedure(s) Performed: Procedure(s): ABLATION A FLUTTER (N/A)  Patient Location: PACU  Anesthesia Type:General  Level of Consciousness: awake, alert , oriented and patient cooperative  Airway and Oxygen Therapy: Patient Spontanous Breathing and Patient connected to nasal cannula oxygen  Post-op Pain: none  Post-op Assessment: Post-op Vital signs reviewed, Patient's Cardiovascular Status Stable, Respiratory Function Stable, Patent Airway, No signs of Nausea or vomiting and Pain level controlled  Post-op Vital Signs: Reviewed and stable  Complications: No apparent anesthesia complications

## 2012-11-26 NOTE — Transfer of Care (Signed)
Immediate Anesthesia Transfer of Care Note  Patient: Jackson Parks  Procedure(s) Performed: Procedure(s): ABLATION A FLUTTER (N/A)  Patient Location: PACU  Anesthesia Type:General  Level of Consciousness: awake, alert , oriented and patient cooperative  Airway & Oxygen Therapy: Patient Spontanous Breathing and Patient connected to nasal cannula oxygen  Post-op Assessment: Report given to PACU RN, Post -op Vital signs reviewed and stable and Patient moving all extremities X 4  Post vital signs: Reviewed and stable  Complications: No apparent anesthesia complications

## 2012-11-26 NOTE — Anesthesia Procedure Notes (Signed)
Procedure Name: Intubation Date/Time: 11/26/2012 1:21 PM Performed by: Armandina Gemma Pre-anesthesia Checklist: Patient identified, Timeout performed, Emergency Drugs available, Suction available and Patient being monitored Patient Re-evaluated:Patient Re-evaluated prior to inductionOxygen Delivery Method: Circle system utilized Preoxygenation: Pre-oxygenation with 100% oxygen Intubation Type: IV induction Ventilation: Mask ventilation without difficulty and Oral airway inserted - appropriate to patient size Laryngoscope Size: Miller and 2 Tube type: Oral Tube size: 7.5 mm Number of attempts: 1 Airway Equipment and Method: Stylet and LTA kit utilized Placement Confirmation: ETT inserted through vocal cords under direct vision,  breath sounds checked- equal and bilateral and positive ETCO2 Secured at: 23 cm Tube secured with: Tape Dental Injury: Teeth and Oropharynx as per pre-operative assessment  Comments: IV induction Smith- teeth and mouth as preop

## 2012-11-26 NOTE — Preoperative (Signed)
Beta Blockers   Reason not to administer Beta Blockers:Not Applicable 

## 2012-11-26 NOTE — Anesthesia Preprocedure Evaluation (Addendum)
Anesthesia Evaluation  Patient identified by MRN, date of birth, ID band Patient awake    Reviewed: Allergy & Precautions, H&P , NPO status , Patient's Chart, lab work & pertinent test results  History of Anesthesia Complications Negative for: history of anesthetic complications  Airway Mallampati: II TM Distance: >3 FB Neck ROM: full   Comment: Dental advisory given Dental  (+) Teeth Intact and Dental Advidsory Given   Pulmonary sleep apnea and Continuous Positive Airway Pressure Ventilation , COPDformer smoker,          Cardiovascular + CAD and + Cardiac Stents + dysrhythmias Atrial Fibrillation     Neuro/Psych    GI/Hepatic negative GI ROS, Neg liver ROS,   Endo/Other  diabetes, Type 2, Oral Hypoglycemic AgentsMorbid obesity  Renal/GU      Musculoskeletal   Abdominal   Peds  Hematology negative hematology ROS (+)   Anesthesia Other Findings   Reproductive/Obstetrics                          Anesthesia Physical Anesthesia Plan  ASA: III  Anesthesia Plan: General   Post-op Pain Management:    Induction: Intravenous  Airway Management Planned: LMA  Additional Equipment:   Intra-op Plan:   Post-operative Plan:   Informed Consent:   Plan Discussed with: Anesthesiologist, CRNA and Surgeon  Anesthesia Plan Comments:        Anesthesia Quick Evaluation

## 2012-11-26 NOTE — CV Procedure (Signed)
Jackson Parks [id number]  [id number]  Preop NW:[id number] Postop Dx same/   Procedure:EPS and RFCA/ LA mapping   Cx: None   Dictation number [id number]  Sherryl Manges, MD 11/26/2012 2:32 PM

## 2012-11-26 NOTE — H&P (View-Only) (Signed)
kf Patient Care Team: Pcp Not In System as PCP - General   HPI  Jackson Parks is a 72 y.o. male Seen in followup for atrial flutter during hospitalization 2/14. Catheterization demonstrated nonobstructive coronary disease and he subsequently underwent TEE guided cardioversion . This demonstrated ejection fraction 50-55% with mild left atrial enlargement  He has been treated with Rivaroxaban.    he feels better following cardioversion Past Medical History  Diagnosis Date  . CAD (coronary artery disease)   . Hypercholesterolemia   . DM (dermatomyositis)   . Obesity   . COPD (chronic obstructive pulmonary disease)     Past Surgical History  Procedure Laterality Date  . Plantar fasciectomy  2005  . Skin lesions      multiple removed  . Steting      stenting of the diagonal branch using a 2.5 x 23 Cypher drug-eluting stent followed by post dilatation  . Tee without cardioversion N/A 10/12/2012    Procedure: TRANSESOPHAGEAL ECHOCARDIOGRAM (TEE);  Surgeon: Lewayne Bunting, MD;  Location[facility] ENDOSCOPY;  Service: Cardiovascular;  Laterality: N/A;  . Cardioversion N/A 10/12/2012    Procedure: CARDIOVERSION;  Surgeon: Lewayne Bunting, MD;  Location: [facility] ENDOSCOPY;  Service: Cardiovascular;  Laterality: N/A;    Current Outpatient Prescriptions  Medication Sig Dispense Refill  . aspirin EC 81 MG tablet Take 81 mg by mouth daily.      . fish oil-omega-3 fatty acids 1000 MG capsule Take 1 g by mouth 2 (two) times daily.       Marland Kitchen glimepiride (AMARYL) 2 MG tablet Take 2 mg by mouth daily before breakfast.      . lisinopril (PRINIVIL,ZESTRIL) 10 MG tablet Take 1 tablet (10 mg total) by mouth every evening.  30 tablet  11  . metoprolol (LOPRESSOR) 50 MG tablet Take 1 tablet (50 mg total) by mouth every evening.  60 tablet  11  . Rivaroxaban (XARELTO) 20 MG TABS Take 1 tablet (20 mg total) by mouth daily.  30 tablet  11  . simvastatin (ZOCOR) 40 MG tablet Take 40 mg by mouth every evening.       . sitaGLIPtan-metformin (JANUMET) 50-1000 MG per tablet Take 1 tablet by mouth 2 (two) times daily with a meal.      . zolpidem (AMBIEN) 10 MG tablet Take 10 mg by mouth at bedtime as needed for sleep.       No current facility-administered medications for this visit.    No Known Allergies  Review of Systems negative except from HPI and PMH  Physical Exam BP 122/80  Pulse 63  Ht 5\' 11"  (1.803 m)  Wt 294 lb (133.358 kg)  BMI 41.02 kg/m2 Well developed and morbidly obese in no acute distress HENT normal E scleral and icterus clear Neck Supple JVP flat; carotids brisk and full Clear to ausculation  Regular rate and rhythm, no murmurs gallops or rub Soft with active bowel sounds No clubbing cyanosis none Edema Alert and oriented, grossly normal motor and sensory function Skin Warm and Dry  1ECG demonstrates sinus rhythm at 63 Intervals 19/16/43 Axis -20 Right bundle branch block  Assessment and  Plan

## 2012-11-26 NOTE — Interval H&P Note (Signed)
History and Physical Interval Note:  11/26/2012 12:59 PM  Jackson Parks  has presented today for surgery, with the diagnosis of AFlutter  The various methods of treatment have been discussed with the patient and family. After consideration of risks, benefits and other options for treatment, the patient has consented to  Procedure(s): ABLATION A FLUTTER (N/A) as a surgical intervention .  The patient's history has been reviewed, patient examined, no change in status, stable for surgery.  I have reviewed the patient's chart and labs.  Questions were answered to the patient's satisfaction.     Sherryl Manges

## 2012-11-27 DIAGNOSIS — I4892 Unspecified atrial flutter: Secondary | ICD-10-CM

## 2012-11-27 LAB — GLUCOSE, CAPILLARY: Glucose-Capillary: 105 mg/dL — ABNORMAL HIGH (ref 70–99)

## 2012-11-27 NOTE — Op Note (Signed)
NAMEWINN, MUEHL            ACCOUNT NO.:  [id number]  MEDICAL RECORD NO.:  [id number]  LOCATION:  2021                         FACILITY:  [facility]  PHYSICIAN:  Duke Salvia, MD, FACCDATE OF BIRTH:  Mar 16, 1941  DATE OF PROCEDURE:  11/26/2012 DATE OF DISCHARGE:                              OPERATIVE REPORT   PREOPERATIVE DIAGNOSIS:  Atrial flutter.  POSTOPERATIVE DIAGNOSIS:  Atrial flutter.  PROCEDURES:  Invasive electrophysiological study with left atrial mapping and catheter ablation.  DESCRIPTION OF PROCEDURE:  Following obtaining informed consent, the patient was brought to the Electrophysiology Laboratory and placed on the fluoroscopic table in the supine position.  After routine prep and drape and submission to general anesthesia under the care of Dr. Krista Blue, catheterization was performed with general anesthesia.  At the end of the procedure, local anesthesia was administered.  CATHETERS:  A 5-French quadripolar catheter was inserted via the left femoral vein to the AV junction. A 6-French octapolar catheter was inserted via the right femoral vein to the coronary sinus. A 7-French dual decapolar catheter was inserted via the left femoral vein to the tricuspid annulus. An 8-French 10-mm deflectable tip ablation catheter was inserted via the right femoral vein via SAFL sheath to mapping sites and posterior septal space.  Surface leads 1, VF and V1 were monitored continuously throughout the procedure.  Following insertion of the catheters, a stimulation protocol included incremental atrial pacing. Incremental ventricular pacing. Single atrial extrastimuli at paced cycle length of 600 milliseconds.  END-TIDAL RESULTS:  End-tidal surface electrocardiogram. Initial rhythm sinus; RR interval 981 milliseconds; PR interval 176 milliseconds; P-wave duration 129 milliseconds; QRS duration 158 milliseconds; QTc interval 435 milliseconds; bundle-branch block - right. AH  interval 92 milliseconds; HV interval 52 milliseconds.  End-tidal final rhythm:  Sinus; RR interval 969 milliseconds; PR interval 186 milliseconds; P-wave duration 136 milliseconds; QRS duration 155 milliseconds; QTc interval 448 milliseconds; bundle-branch block - present - right. AH interval 97 milliseconds; HV interval 59 milliseconds.  Atrial AV nodal function. AV Wenckebach was 360 milliseconds. VA dissociation was noted at 500 milliseconds. The AV nodal effective refractory period at 600 milliseconds was between 400 and 300 milliseconds. No evidence of dual AV nodal physiology was identified with PR interval greater than RR interval.  ACCESSORY PATHWAY FUNCTION:  No evidence of an accessory pathway was identified.  Ventricular response programmed stimulation was normal for ventricular pacing as described above.  Arrhythmias induced; the patient had previously been identified as having atrial flutter.  It was typical.  It was elected to ablate in sinus rhythm.  Coronary sinus pacing without for mapping across the tricuspid isthmus.  Catheter ablation successfully resulted in bidirectional isthmus block. A total of 4 minutes and 34 seconds of RF energy was applied across the isthmus.  FLUOROSCOPY TIME:  A total of 5.7 minutes of fluoroscopy time was utilized.  IMPRESSION: 1. Normal sinus rhythm. 2. Abnormal atrial rhythm demonstrated by previous evidence of atrial     flutter with successful cavotricuspid isthmus ablation. 3. Normal atrioventricular nodal function. 4. Normal His-Purkinje system function. 5. No accessory pathway. 6. Normal ventricular response programmed stimulation.  In conclusion, the results of electrophysiological testing confirmed cavotricuspid isthmus conduction and its  elimination with RF ablation.  The patient will be observed overnight and anticipate discharge in the morning.     Duke Salvia, MD, [facility]     SCK/MEDQ  D:   11/26/2012  T:  11/27/2012  Job:  [id number]

## 2012-11-27 NOTE — Progress Notes (Signed)
D/c instructions provided along with med list. IVs d/c'd with catheters intact. Patient transported to main lobby for d/c home. Mamie Levers

## 2012-11-27 NOTE — Progress Notes (Signed)
   ELECTROPHYSIOLOGY ROUNDING NOTE    Patient Name: Jackson Parks Date of Encounter: 11-27-2012    SUBJECTIVE:Patient feels well.  No chest pain or shortness of breath.  S/p RFCA atrial flutter 11-26-2012  TELEMETRY: Reviewed telemetry pt in sinus rhythm: Filed Vitals:   11/26/12 1723 11/26/12 1921 11/26/12 1949 11/27/12 0505  BP: 136/71 117/67 120/74 99/63  Pulse:   66 70  Temp:   98.2 F (36.8 C) 98.1 F (36.7 C)  TempSrc:   Oral Oral  Resp:  18 18 19   Height:      Weight:      SpO2:   96% 93%    Intake/Output Summary (Last 24 hours) at 11/27/12 0645 Last data filed at 11/26/12 2200  Gross per 24 hour  Intake    980 ml  Output     25 ml  Net    955 ml    PHYSICAL EXAM Groin without complication Well developed and nourished in no acute distress HENT normal Neck supple with JVP-flat Clear Regular rate and rhythm, no murmurs or gallops Abd-soft with active BS No Clubbing cyanosis edema Skin-warm and dry A & Oriented  Grossly normal sensory and motor function   Wound care, restrictions reviewed with patient.  Routine follow up scheduled. Stop anticoagulation

## 2012-11-27 NOTE — Discharge Summary (Signed)
ELECTROPHYSIOLOGY PROCEDURE DISCHARGE SUMMARY    Patient ID: Jackson Parks,  MRN: [id number], DOB/AGE: 1941-01-09 72 y.o.  Admit date: 11/26/2012 Discharge date: 11/27/2012  Primary Care Physician: [facility] Primary Cardiologist: Peter Swaziland, MD (formerly Dr Riley Kill) Electrophysiologist: Dr Graciela Husbands  Primary Discharge Diagnosis:  Atrial flutter status post ablation this admission  Secondary Discharge Diagnosis:  1.  CAD- s/p DES stent to diagonal 2.  Hyperlipidemia 3.  COPD 4.  Obesity  Procedures This Admission:  1.  Electrophysiology study and radiofrequency catheter ablation of atrial flutter on 11-26-2012 by Dr Graciela Husbands.  This study demonstrated sinus rhythm on presentation, successful cavotricuspid isthmus ablation.  There were no early apparent complications.   Brief HPI: Jackson Parks is a 72 year old male who was identified to have atrial flutter earlier this year at which time he underwent TEE guided cardioversion.  He was placed on Xarelto and maintained sinus rhythm.  Plans were made for definitive therapy of atrial flutter including ablation.  Risks, benefits, and alternatives were reviewed with the patient who wished to proceed.   Hospital Course:  The patient was admitted and underwent EPS/RFCA of atrial flutter on 11-26-2012 with details as outlined above.  He was monitored on telemetry overnight which demonstrated sinus rhythm.  His groin was without complication.  Dr Graciela Husbands examined the patient and considered him stable for discharge to home.  Because of maintenance of sinus rhythm since TEE/DCCV, Xarelto was discontinued after successful ablation.   Discharge Vitals: Blood pressure 99/63, pulse 70, temperature 98.1 F (36.7 C), temperature source Oral, resp. rate 19, height 5\' 11"  (1.803 m), weight 290 lb (131.543 kg), SpO2 93.00%.    Labs:   Lab Results  Component Value Date   WBC 8.7 11/19/2012   HGB 14.0 11/19/2012   HCT 41.5 11/19/2012   MCV 93.0  11/19/2012   PLT 224.0 11/19/2012     Discharge Medications:    Medication List    STOP taking these medications       Rivaroxaban 20 MG Tabs  Commonly known as:  XARELTO      TAKE these medications       aspirin EC 81 MG tablet  Take 81 mg by mouth daily.     fish oil-omega-3 fatty acids 1000 MG capsule  Take 1 g by mouth 2 (two) times daily.     glimepiride 2 MG tablet  Commonly known as:  AMARYL  Take 2 mg by mouth daily before breakfast.     lisinopril 10 MG tablet  Commonly known as:  PRINIVIL,ZESTRIL  Take 1 tablet (10 mg total) by mouth every evening.     metoprolol 50 MG tablet  Commonly known as:  LOPRESSOR  Take 1 tablet (50 mg total) by mouth every evening.     simvastatin 40 MG tablet  Commonly known as:  ZOCOR  Take 40 mg by mouth every evening.     sitaGLIPtan-metformin 50-1000 MG per tablet  Commonly known as:  JANUMET  Take 1 tablet by mouth 2 (two) times daily with a meal.     zolpidem 10 MG tablet  Commonly known as:  AMBIEN  Take 10 mg by mouth at bedtime as needed for sleep.        Disposition:       Future Appointments Provider Department Dept Phone   12/30/2012 10:30 AM Duke Salvia, MD [facility] Main Office [place]) [phone]   01/26/2013 10:15 AM Peter M Swaziland, MD [facility] Main Office (  [place]) [phone]       Duration of Discharge Encounter: Greater than 30 minutes including physician time.  Signed, Gypsy Balsam, RN, BSN 11/27/2012, 11:37 AM

## 2012-12-30 ENCOUNTER — Encounter: Payer: Self-pay | Admitting: Internal Medicine

## 2012-12-30 ENCOUNTER — Ambulatory Visit (INDEPENDENT_AMBULATORY_CARE_PROVIDER_SITE_OTHER): Payer: Medicare Other | Admitting: Internal Medicine

## 2012-12-30 VITALS — BP 113/73 | HR 70 | Ht 71.0 in | Wt 301.0 lb

## 2012-12-30 DIAGNOSIS — I4892 Unspecified atrial flutter: Secondary | ICD-10-CM

## 2012-12-30 NOTE — Progress Notes (Signed)
Patient Care Team: Virl Cagey, MD as PCP - General (Internal Medicine)   HPI  Jackson Parks is a 72 y.o. male Seen following catheter ablation of atrial flutter  EF 55%   He has CAD with prior stenting of Diagonal   LHC 2/14 patent stent without other obstruction  Past Medical History  Diagnosis Date  . CAD (coronary artery disease)   . Hypercholesterolemia   . DM (dermatomyositis)   . Obesity   . COPD (chronic obstructive pulmonary disease)     Past Surgical History  Procedure Laterality Date  . Plantar fasciectomy  2005  . Skin lesions      multiple removed  . Steting      stenting of the diagonal branch using a 2.5 x 23 Cypher drug-eluting stent followed by post dilatation  . Tee without cardioversion N/A 10/12/2012    Procedure: TRANSESOPHAGEAL ECHOCARDIOGRAM (TEE);  Surgeon: Lewayne Bunting, MD;  Location: [facility] ENDOSCOPY;  Service: Cardiovascular;  Laterality: N/A;  . Cardioversion N/A 10/12/2012    Procedure: CARDIOVERSION;  Surgeon: Lewayne Bunting, MD;  Location: [facility] ENDOSCOPY;  Service: Cardiovascular;  Laterality: N/A;    Current Outpatient Prescriptions  Medication Sig Dispense Refill  . aspirin EC 81 MG tablet Take 81 mg by mouth daily.      . fish oil-omega-3 fatty acids 1000 MG capsule Take 1 g by mouth 2 (two) times daily.       Marland Kitchen glimepiride (AMARYL) 2 MG tablet Take 2 mg by mouth daily before breakfast.      . lisinopril (PRINIVIL,ZESTRIL) 10 MG tablet Take 1 tablet (10 mg total) by mouth every evening.  30 tablet  11  . metoprolol (LOPRESSOR) 50 MG tablet Take 1 tablet (50 mg total) by mouth every evening.  60 tablet  11  . simvastatin (ZOCOR) 40 MG tablet Take 40 mg by mouth every evening.      . sitaGLIPtan-metformin (JANUMET) 50-1000 MG per tablet Take 1 tablet by mouth 2 (two) times daily with a meal.      . zolpidem (AMBIEN) 10 MG tablet Take 10 mg by mouth at bedtime as needed for sleep.       No current facility-administered medications for this  visit.    No Known Allergies  Review of Systems negative except from HPI and PMH  Physical Exam BP 113/73  Pulse 70  Ht 5\' 11"  (1.803 m)  Wt 301 lb (136.533 kg)  BMI 42 kg/m2 Well developed and nourished in no acute distress HENT normal Neck supple with JVP-flat Clear Regular rate and rhythm, no murmurs or gallops Abd-soft with active BS No Clubbing cyanosis edema Skin-warm and dry A & Oriented  Grossly normal sensory and motor function   ECG demonstrates sinus rhythm at 69 Intervals 19/15/42 Axis XXXVII  RBBB  Assessment and  Plan

## 2012-12-30 NOTE — Assessment & Plan Note (Signed)
Stable post ablation. He is now off of his anticoagulant. He will follow up with Dr. Swaziland.

## 2012-12-30 NOTE — Patient Instructions (Signed)
Your physician recommends that you schedule a follow-up appointment as needed with Dr. Graciela Husbands

## 2013-01-26 ENCOUNTER — Encounter: Payer: Self-pay | Admitting: Cardiology

## 2013-01-26 ENCOUNTER — Ambulatory Visit (INDEPENDENT_AMBULATORY_CARE_PROVIDER_SITE_OTHER): Payer: Medicare Other | Admitting: Cardiology

## 2013-01-26 VITALS — BP 124/66 | HR 80 | Ht 71.0 in | Wt 293.0 lb

## 2013-01-26 DIAGNOSIS — I2510 Atherosclerotic heart disease of native coronary artery without angina pectoris: Secondary | ICD-10-CM

## 2013-01-26 DIAGNOSIS — I4892 Unspecified atrial flutter: Secondary | ICD-10-CM

## 2013-01-26 DIAGNOSIS — E7800 Pure hypercholesterolemia, unspecified: Secondary | ICD-10-CM

## 2013-01-26 NOTE — Patient Instructions (Signed)
We will schedule you to see one of our sleep specialists in pulmonary for treatment of sleep apnea.  Continue your current therapy. You may stop metoprolol  I will see you in 6 months with fasting lab work

## 2013-01-30 NOTE — Progress Notes (Signed)
Jackson Parks Date of Birth: 1941-04-01 Medical Record #[id number]  History of Present Illness: Jackson Parks is seen for followup today. He is a former patient of Dr. Riley Kill. He has a history of atrial flutter status post DC cardioversion. He subsequently had an ablation procedure by Dr. Graciela Husbands in April 2014. He has a history of coronary disease and is status post stenting of the diagonal branch. Cardiac catheterization in February 2014 showed continued patency of the stent and nonobstructive disease. He also had normal right heart pressures at that time. On followup today he is feeling very well. He still has some chronic shortness of breath. He does have obstructive sleep apnea and apparently had a positive sleep study 10 years ago. He has had difficulty tolerating CPAP therapy and does have a full facemask but hasn't been using it.  Current Outpatient Prescriptions on File Prior to Visit  Medication Sig Dispense Refill  . aspirin EC 81 MG tablet Take 81 mg by mouth daily.      . fish oil-omega-3 fatty acids 1000 MG capsule Take 1 g by mouth 2 (two) times daily.       Marland Kitchen glimepiride (AMARYL) 2 MG tablet Take 2 mg by mouth daily before breakfast.      . lisinopril (PRINIVIL,ZESTRIL) 10 MG tablet Take 1 tablet (10 mg total) by mouth every evening.  30 tablet  11  . simvastatin (ZOCOR) 40 MG tablet Take 40 mg by mouth every evening.      . sitaGLIPtan-metformin (JANUMET) 50-1000 MG per tablet Take 1 tablet by mouth 2 (two) times daily with a meal.      . zolpidem (AMBIEN) 10 MG tablet Take 10 mg by mouth at bedtime as needed for sleep.       No current facility-administered medications on file prior to visit.    No Known Allergies  Past Medical History  Diagnosis Date  . CAD (coronary artery disease)   . Hypercholesterolemia   . DM (dermatomyositis)   . Obesity   . COPD (chronic obstructive pulmonary disease)   . Atrial flutter 10/05/2012    Past Surgical History  Procedure  Laterality Date  . Plantar fasciectomy  2005  . Skin lesions      multiple removed  . Steting      stenting of the diagonal branch using a 2.5 x 23 Cypher drug-eluting stent followed by post dilatation  . Tee without cardioversion N/A 10/12/2012    Procedure: TRANSESOPHAGEAL ECHOCARDIOGRAM (TEE);  Surgeon: Lewayne Bunting, MD;  Location: [facility] ENDOSCOPY;  Service: Cardiovascular;  Laterality: N/A;  . Cardioversion N/A 10/12/2012    Procedure: CARDIOVERSION;  Surgeon: Lewayne Bunting, MD;  Location: [facility] ENDOSCOPY;  Service: Cardiovascular;  Laterality: N/A;    History  Smoking status  . Former Smoker  . Quit date: 12/23/2000  Smokeless tobacco  . Not on file    History  Alcohol Use  . Yes    Comment: two margaritas per year    Family History  Problem Relation Age of Onset  . Heart attack Father 31    deceased  . Heart failure Mother 32    deceased  . Coronary artery disease Brother 81  . Peripheral vascular disease Brother 41    Review of Systems: As noted in history of present illness.  All other systems were reviewed and are negative.  Physical Exam: BP 124/66  Pulse 80  Ht 5\' 11"  (1.803 m)  Wt 293 lb (132.904 kg)  BMI 40.88  kg/m2 He is an obese white male in no acute distress. HEENT: Normal. No JVD or bruits. Lungs: Clear Cardiovascular: Regular rate and rhythm without gallop, murmur, or click. Abdomen: Soft and nontender. No masses or bruits. No hepatosplenomegaly. Extremities: No cyanosis or edema. Femoral and pedal pulses are 2+. Skin: Warm and dry Neuro: Alert and oriented x3. Cranial nerves II through XII are intact.  Laboratory data:  Assessment: 1. Coronary disease status post stenting of the diagonal. The patient is asymptomatic. Cardiac catheterization February showed nonobstructive disease. Continue baby aspirin and statin therapy.  2. Atrial flutter status post successful ablation in April. No recurrence.  3. Hypercholesterolemia. On Zocor.  4.  Obstructive sleep apnea. Have made referral to pulmonary for further management.

## 2013-02-23 ENCOUNTER — Institutional Professional Consult (permissible substitution): Payer: Medicare Other | Admitting: Pulmonary Disease

## 2013-04-05 ENCOUNTER — Encounter: Payer: Self-pay | Admitting: Pulmonary Disease

## 2013-04-05 ENCOUNTER — Ambulatory Visit (INDEPENDENT_AMBULATORY_CARE_PROVIDER_SITE_OTHER): Payer: Medicare Other | Admitting: Pulmonary Disease

## 2013-04-05 VITALS — BP 120/82 | HR 73 | Temp 97.9°F | Ht 69.5 in | Wt 303.8 lb

## 2013-04-05 DIAGNOSIS — G4733 Obstructive sleep apnea (adult) (pediatric): Secondary | ICD-10-CM

## 2013-04-05 NOTE — Progress Notes (Signed)
Subjective:    Patient ID: Jackson Parks, male    DOB: 1941-03-07, 72 y.o.   MRN: [id number]  HPI The patient is a 72 year old male who I have been asked to see for management of obstructive sleep apnea.  He was diagnosed in 2003 with OSA, but his sleep study is not available for my review.  He was started on CPAP with a nasal mask and chin strap, but could not tolerate.  He was given a full face mask, but never took it out of the box to use.  He used his machine for only one week.  The patient has been noted to have loud snoring, as well as witnessed apneas during the night.  He has frequent awakenings, and has not rested in the mornings upon arising.  He stays very active through the day, but his wife states that he will get sleepy and even doze when he sits down.  He will also fall sleep in the evening watching television.  He denies any issues with sleepiness while driving.  Patient states that his weight is neutral from 2003, and his Epworth score today is 16.     Sleep Questionnaire What time do you typically go to bed?( Between what hours) 10-11p 10-11p at 0923 on 04/05/13 by Maisie Fus, CMA How long does it take you to fall asleep? immediately immediately at 0923 on 04/05/13 by Maisie Fus, CMA How many times during the night do you wake up? 1010 multiple timesx 10+--without Ambien at 0923 on 04/05/13 by Maisie Fus, CMA What time do you get out of bed to start your day? [id number] btw 7-9a at 0923 on 04/05/13 by Maisie Fus, CMA Do you drive or operate heavy machinery in your occupation? No No at 0923 on 04/05/13 by Maisie Fus, CMA How much has your weight changed (up or down) over the past two years? (In pounds) 5 lb (2.268 kg)5 lb (2.268 kg) +/- varies at 0923 on 04/05/13 by Maisie Fus, CMA Have you ever had a sleep study before? Yes Yes at 0923 on 04/05/13 by Maisie Fus, CMA If yes, location of study? [employer]  at [phone] on 04/05/13 by  Maisie Fus, CMA If yes, date of study? 2003/2004 2003/2004 at 0923 on 04/05/13 by Maisie Fus, CMA Do you currently use CPAP? NoNo owns CPAP--does not use at 0923 on 04/05/13 by Maisie Fus, CMA Do you wear oxygen at any time? No No at 0923 on 04/05/13 by Maisie Fus, CMA   Review of Systems  Constitutional: Negative for fever and unexpected weight change.  HENT: Positive for congestion and sneezing. Negative for ear pain, nosebleeds, sore throat, rhinorrhea, trouble swallowing, dental problem, postnasal drip and sinus pressure.   Eyes: Negative for redness and itching.  Respiratory: Negative for cough, chest tightness, shortness of breath and wheezing.   Cardiovascular: Positive for palpitations ( irregular heartbeat). Negative for leg swelling.  Gastrointestinal: Negative for nausea and vomiting.  Genitourinary: Negative for dysuria.  Musculoskeletal: Positive for joint swelling.  Skin: Negative for rash.  Neurological: Negative for headaches.  Hematological: Does not bruise/bleed easily.  Psychiatric/Behavioral: Negative for dysphoric mood. The patient is not nervous/anxious.        Objective:   Physical Exam Constitutional:  Morbidly obese male, no acute distress  HENT:  Nares patent without discharge, but narrowed with turbinate hypertrophy and septal deviation  Oropharynx without exudate, palate and uvula are elongated.  Eyes:  Perrla, eomi, no scleral icterus  Neck:  No JVD, no TMG  Cardiovascular:  Normal rate, regular rhythm, no rubs or gallops.  No murmurs        Intact distal pulses  Pulmonary :  Normal breath sounds, no stridor or respiratory distress   No rales, rhonchi, or wheezing  Abdominal:  Soft, nondistended, bowel sounds present.  No tenderness noted.   Musculoskeletal:  1+ lower extremity edema noted, +varicosities  Lymph Nodes:  No cervical lymphadenopathy noted  Skin:  No cyanosis noted  Neurologic:  Alert, appropriate, moves  all 4 extremities without obvious deficit.         Assessment & Plan:

## 2013-04-05 NOTE — Patient Instructions (Addendum)
Will arrange a home care company to restart cpap.  Will set on a moderate pressure to start, and would consider looking at some of the newer full face masks. Please call if you have tolerance issues, and we can troubleshoot with you. Work on weight loss.  followup with me in 6 weeks.

## 2013-04-05 NOTE — Assessment & Plan Note (Signed)
The patient has been diagnosed with obstructive sleep apnea in 2003, but has not been compliant with CPAP.  He now has multiple medical issues which can be greatly influenced by sleep disordered breathing, and he is also disturbing his wife's sleep quality of life.  At this point, I have recommended that he restart on CPAP, and he is willing to give this a try.  I will work with him on desensitization, and also encouraged him to work aggressively on weight loss.

## 2013-04-19 ENCOUNTER — Telehealth: Payer: Self-pay | Admitting: Pulmonary Disease

## 2013-04-19 NOTE — Telephone Encounter (Signed)
EPIC shows order was sent to [facility] on 04/05/13. I called [facility] and spoke with Selena Batten. She stated they have been leaving messages and had no return call. I did confirm they had the correct #. They will try them back again in a few minutes.   I called and made spouse aware. Nothing further needed

## 2013-05-17 ENCOUNTER — Encounter: Payer: Self-pay | Admitting: Pulmonary Disease

## 2013-05-17 ENCOUNTER — Ambulatory Visit (INDEPENDENT_AMBULATORY_CARE_PROVIDER_SITE_OTHER): Payer: Medicare Other | Admitting: Pulmonary Disease

## 2013-05-17 VITALS — BP 118/72 | HR 80 | Temp 97.9°F | Ht 70.0 in | Wt 304.0 lb

## 2013-05-17 DIAGNOSIS — G4733 Obstructive sleep apnea (adult) (pediatric): Secondary | ICD-10-CM

## 2013-05-17 NOTE — Assessment & Plan Note (Signed)
The patient is doing very well on his current CPAP setup, and feels that he is sleeping better with improved daytime alertness.  We will need to optimize his pressure, and we'll do this on the automatic setting Care Plan:  At this point, will arrange for the patient's machine to be changed over to auto mode for 2 weeks to optimize their pressure.  I will review the downloaded data once sent by dme, and also evaluate for compliance, leaks, and residual osa.  I will call the patient and dme to discuss the results, and have the patient's machine set appropriately.  This will serve as the pt's cpap pressure titration.

## 2013-05-17 NOTE — Progress Notes (Signed)
  Subjective:    Patient ID: Jackson Parks, male    DOB: September 13, 1940, 72 y.o.   MRN: [id number]  HPI The patient comes in today for followup of his known obstructive sleep apnea.  He was started back on CPAP at the last visit, and has done very well with his device since that time.  He is wearing compliantly, and thinks it has helped his sleep and daytime alertness.  He has found a face mask that works very well for him, and feels that it is comfortable.   Review of Systems  Constitutional: Negative for fever and unexpected weight change.  HENT: Positive for congestion, rhinorrhea, postnasal drip and sinus pressure. Negative for ear pain, nosebleeds, sore throat, sneezing, trouble swallowing and dental problem.        Sinus infection  Eyes: Negative for redness and itching.  Respiratory: Negative for cough, chest tightness, shortness of breath and wheezing.   Cardiovascular: Negative for palpitations and leg swelling.  Gastrointestinal: Negative for nausea and vomiting.  Genitourinary: Negative for dysuria.  Musculoskeletal: Negative for joint swelling.  Skin: Negative for rash.  Neurological: Negative for headaches.  Hematological: Does not bruise/bleed easily.  Psychiatric/Behavioral: Negative for dysphoric mood. The patient is not nervous/anxious.        Objective:   Physical Exam Obese male in no acute distress Nose without purulence or discharge noted No skin breakdown or pressure necrosis from the CPAP mask Neck without lymphadenopathy or thyromegaly Lower extremities with minimal edema, no cyanosis Alert and oriented, moves all 4 extremities.       Assessment & Plan:

## 2013-05-17 NOTE — Patient Instructions (Addendum)
Will optimize your pressure on the auto setting for the next 2 weeks, and will let you know the results. Work on weight loss Keep up with mask changes and supplies. followup with me in 6mos

## 2013-05-28 ENCOUNTER — Telehealth: Payer: Self-pay | Admitting: Pulmonary Disease

## 2013-05-28 DIAGNOSIS — G4733 Obstructive sleep apnea (adult) (pediatric): Secondary | ICD-10-CM

## 2013-05-28 NOTE — Telephone Encounter (Signed)
Can limit the cpap pressure to 5-15cm, but understanding the reason the machine cycles to that pressure is because it needs treat the severity of the sleep apnea properly.  Will get download after 2 weeks on new pressure limits.

## 2013-05-28 NOTE — Telephone Encounter (Signed)
lmomtcb x1 

## 2013-05-28 NOTE — Telephone Encounter (Signed)
Last visit was on 05/17/13 and an order was placed to set pt on auto 5-20 with 2 week download. Pt spouse states he used it for 2 nights and felt like the pressure would go to high and would blow the mask off his face. She states he stopped using the auto machine and went back to his machine. They are asking if the range can be changed to where the pressure will not go as high? Please advise. Carron Curie, CMA

## 2013-05-31 NOTE — Telephone Encounter (Signed)
Wife aware of recs.order sent

## 2013-08-15 ENCOUNTER — Other Ambulatory Visit: Payer: Self-pay | Admitting: Pulmonary Disease

## 2013-08-15 DIAGNOSIS — G4733 Obstructive sleep apnea (adult) (pediatric): Secondary | ICD-10-CM

## 2013-08-25 ENCOUNTER — Telehealth: Payer: Self-pay | Admitting: Pulmonary Disease

## 2013-08-26 NOTE — Telephone Encounter (Signed)
Order re-faxed to [facility]. Confirmation received. Nothing else needed at this point. Rhonda J Cobb

## 2013-11-15 ENCOUNTER — Ambulatory Visit (INDEPENDENT_AMBULATORY_CARE_PROVIDER_SITE_OTHER): Payer: Medicare Other | Admitting: Pulmonary Disease

## 2013-11-15 ENCOUNTER — Encounter (INDEPENDENT_AMBULATORY_CARE_PROVIDER_SITE_OTHER): Payer: Self-pay

## 2013-11-15 ENCOUNTER — Encounter: Payer: Self-pay | Admitting: Pulmonary Disease

## 2013-11-15 VITALS — BP 122/76 | HR 79 | Temp 98.0°F | Ht 70.0 in | Wt 293.0 lb

## 2013-11-15 DIAGNOSIS — G4733 Obstructive sleep apnea (adult) (pediatric): Secondary | ICD-10-CM

## 2013-11-15 NOTE — Patient Instructions (Signed)
Continue with cpap, and keep up with mask cushion changes every 28mos. Work on [employer] loss Try some of the behavioral therapies we discussed for your sleep maintenance issue.  Make sure you do not nap during day or catnap in evening watching tv.  Try exercising in afternoons. followup with me again in 1 year if doing well.

## 2013-11-15 NOTE — Assessment & Plan Note (Signed)
The patient is wearing CPAP compliantly, and is not having any issues with his mask fit or pressure. He feels that his device is working appropriately, and overall is satisfied with his daytime alertness. His only complaint today is related to sleep maintenance, and I have reviewed various behavioral therapies with him. I've also encouraged him to work aggressively on weight loss, and consider exercising daily in the afternoon to help his sleep.

## 2013-11-15 NOTE — Progress Notes (Signed)
   Subjective:    Patient ID: Jackson Parks, male    DOB: Jan 23, 1941, 73 y.o.   MRN: [id number]  HPI The patient comes in today for followup of his obstructive sleep apnea. He is wearing CPAP compliantly, and does not have any significant issues with his device. Overall, he feels he sleeps well with it, and has adequate daytime alertness. He has actually lost weight since last visit. His only complaint is difficulty awakening between 1 and 3 in the morning, and not being able to return to sleep. He is unsure why this is a problem. There is not a download available to see if it is related to his sleep disordered breathing. He denies any chronic pain issues, but does catnap in the evening watching television.   Review of Systems  Constitutional: Negative for fever and unexpected weight change.  HENT: Negative for congestion, dental problem, ear pain, nosebleeds, postnasal drip, rhinorrhea, sinus pressure, sneezing, sore throat and trouble swallowing.   Eyes: Negative for redness and itching.  Respiratory: Negative for cough, chest tightness, shortness of breath and wheezing.   Cardiovascular: Negative for palpitations and leg swelling.  Gastrointestinal: Negative for nausea and vomiting.  Genitourinary: Negative for dysuria.  Musculoskeletal: Negative for joint swelling.  Skin: Negative for rash.  Neurological: Negative for headaches.  Hematological: Does not bruise/bleed easily.  Psychiatric/Behavioral: Negative for dysphoric mood. The patient is not nervous/anxious.        Objective:   Physical Exam Obese male in no acute distress Nose without purulence or discharge noted Neck without lymphadenopathy or thyromegaly No skin breakdown or pressure necrosis from the CPAP mask Lower extremities with mild edema, no cyanosis Alert and oriented, moves all 4 extremities.       Assessment & Plan:

## 2013-12-20 ENCOUNTER — Other Ambulatory Visit (HOSPITAL_COMMUNITY): Payer: Self-pay | Admitting: Physician Assistant

## 2014-02-05 ENCOUNTER — Other Ambulatory Visit (HOSPITAL_COMMUNITY): Payer: Self-pay | Admitting: Cardiology

## 2014-03-12 ENCOUNTER — Other Ambulatory Visit (HOSPITAL_COMMUNITY): Payer: Self-pay | Admitting: Cardiology

## 2014-04-28 ENCOUNTER — Ambulatory Visit: Payer: Self-pay | Admitting: Unknown Physician Specialty

## 2014-05-21 ENCOUNTER — Other Ambulatory Visit: Payer: Self-pay | Admitting: Cardiology

## 2014-07-04 DIAGNOSIS — M75122 Complete rotator cuff tear or rupture of left shoulder, not specified as traumatic: Secondary | ICD-10-CM | POA: Insufficient documentation

## 2014-07-21 ENCOUNTER — Encounter (HOSPITAL_COMMUNITY): Payer: Self-pay | Admitting: Cardiology

## 2014-10-20 ENCOUNTER — Telehealth: Payer: Self-pay | Admitting: Pulmonary Disease

## 2014-10-20 DIAGNOSIS — G4733 Obstructive sleep apnea (adult) (pediatric): Secondary | ICD-10-CM

## 2014-10-20 NOTE — Telephone Encounter (Signed)
Spoke with pt's wife, Romie Minus. States that they are in desperate need for a new CPAP machine. His current machine is working working, he has had this machine 15+ years. [place] in [place], [place]. There is a [facility] where they are.  [place] - are you okay with [place] ordering this?

## 2014-10-20 NOTE — Telephone Encounter (Signed)
Ok to send in:  resmed s10 air/auto with h/h and climate control tubing.  Keep on same pressure setting.  Enroll in [place].

## 2014-10-20 NOTE — Telephone Encounter (Signed)
LMTCB

## 2014-10-21 NOTE — Telephone Encounter (Addendum)
Spoke with pt's wife, Romie Minus & she is aware that Estill Bamberg @[facility] , [place], [place] will contact them when she receives this Order.  Spouse also aware that per Estill Bamberg pt probably will not be able to pick up a replacement CPAP today, spouse voiced understanding.  Order faxed.  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  Estill Bamberg @[facility] , [place] aware Maudie Mercury is forwarding pt's file to that office Kara Mead

## 2014-10-21 NOTE — Telephone Encounter (Signed)
Patients spouse called back and said she needs the CPAP today.  She said that he cannot go another day without his CPAP machine.  She would like to know which location she can pick up machine.  She would like our office to call them and make sure that they have the order and will allow them to pick up a machine today.   To [facility]:  Please follow up on this order.. Thanks.

## 2014-10-21 NOTE — Telephone Encounter (Signed)
Called spoke with spouse. She wants order sent to [facility] (p) [phone] and if they do not accept insruance then can try [facility] ([place]) [phone]. Order placed. Nothing further needed

## 2014-11-16 ENCOUNTER — Ambulatory Visit: Payer: Medicare Other | Admitting: Pulmonary Disease

## 2014-11-23 ENCOUNTER — Ambulatory Visit: Payer: Self-pay | Admitting: Podiatry

## 2014-11-30 ENCOUNTER — Ambulatory Visit (INDEPENDENT_AMBULATORY_CARE_PROVIDER_SITE_OTHER): Payer: Medicare Other | Admitting: Pulmonary Disease

## 2014-11-30 ENCOUNTER — Encounter (INDEPENDENT_AMBULATORY_CARE_PROVIDER_SITE_OTHER): Payer: Self-pay

## 2014-11-30 ENCOUNTER — Encounter: Payer: Self-pay | Admitting: Pulmonary Disease

## 2014-11-30 VITALS — BP 114/62 | HR 68 | Temp 98.4°F | Ht 70.0 in | Wt 292.4 lb

## 2014-11-30 DIAGNOSIS — G4733 Obstructive sleep apnea (adult) (pediatric): Secondary | ICD-10-CM

## 2014-11-30 NOTE — Progress Notes (Signed)
   Subjective:    Patient ID: Jackson Parks, male    DOB: [birth year], 75 y.o.   MRN: [id number]  HPI The patient comes in today for follow-up of his obstructive sleep apnea. He is wearing C Pap compliantly by his download, with good control of his AHI. He is having no issues with his mask or pressure setting. His weight is stable since his last visit a year ago. He feels that he is sleeping very well with his device, and has no significant daytime sleepiness issues.   Review of Systems  Constitutional: Negative for fever, chills, activity change, appetite change and unexpected weight change.  HENT: Negative for congestion, dental problem, postnasal drip, rhinorrhea, sneezing, sore throat, trouble swallowing and voice change.   Eyes: Negative for visual disturbance.  Respiratory: Positive for apnea. Negative for cough, choking and shortness of breath.   Cardiovascular: Negative for chest pain and leg swelling.  Gastrointestinal: Negative for nausea, vomiting and abdominal pain.  Genitourinary: Negative for difficulty urinating.  Musculoskeletal: Negative for arthralgias.  Skin: Negative for rash.  Psychiatric/Behavioral: Negative for behavioral problems and confusion.       Objective:   Physical Exam  Obese male in no acute distress Nose without purulence or discharge noted Neck without lymphadenopathy or thyromegaly No skin breakdown or pressure necrosis from the C Pap mask Lower extremities with mild edema, no cyanosis Alert and oriented, moves all 4 extremities.        Assessment & Plan:

## 2014-11-30 NOTE — Patient Instructions (Signed)
Continue on cpap, and keep up with mask cushion changes and supplies. Work on weight loss followup again in one year.

## 2014-11-30 NOTE — Assessment & Plan Note (Signed)
The patient is doing extremely well with his new C Pap device, and his download shows great compliance and good control of his AHI. Asked him to continue on his C Pap, and to work aggressively on weight loss. I have also reminded him to keep up with his mask cushion changes and supplies.

## 2014-11-30 NOTE — Progress Notes (Deleted)
   Subjective:    Patient ID: Jackson Parks, male    DOB: 1941/02/28, 74 y.o.   MRN: [id number]  HPI    Review of Systems  Constitutional: Negative for fever and unexpected weight change.  HENT: Negative for congestion, dental problem, ear pain, nosebleeds, postnasal drip, rhinorrhea, sinus pressure, sneezing, sore throat and trouble swallowing.   Eyes: Negative for redness and itching.  Respiratory: Negative for cough, chest tightness, shortness of breath and wheezing.   Cardiovascular: Negative for palpitations and leg swelling.  Gastrointestinal: Negative for nausea and vomiting.  Genitourinary: Negative for dysuria.  Musculoskeletal: Negative for joint swelling.  Skin: Negative for rash.  Neurological: Negative for headaches.  Hematological: Does not bruise/bleed easily.  Psychiatric/Behavioral: Negative for dysphoric mood. The patient is not nervous/anxious.        Objective:   Physical Exam        Assessment & Plan:

## 2014-12-05 ENCOUNTER — Encounter: Payer: Self-pay | Admitting: Podiatry

## 2014-12-05 ENCOUNTER — Ambulatory Visit (INDEPENDENT_AMBULATORY_CARE_PROVIDER_SITE_OTHER): Payer: Medicare Other | Admitting: Podiatry

## 2014-12-05 ENCOUNTER — Ambulatory Visit (INDEPENDENT_AMBULATORY_CARE_PROVIDER_SITE_OTHER): Payer: Medicare Other

## 2014-12-05 VITALS — BP 150/83 | HR 73 | Resp 16

## 2014-12-05 DIAGNOSIS — M778 Other enthesopathies, not elsewhere classified: Secondary | ICD-10-CM

## 2014-12-05 DIAGNOSIS — M7752 Other enthesopathy of left foot: Secondary | ICD-10-CM | POA: Diagnosis not present

## 2014-12-05 DIAGNOSIS — M19072 Primary osteoarthritis, left ankle and foot: Secondary | ICD-10-CM

## 2014-12-05 DIAGNOSIS — M779 Enthesopathy, unspecified: Secondary | ICD-10-CM

## 2014-12-05 NOTE — Patient Instructions (Signed)
Diabetes and Foot Care Diabetes may cause you to have problems because of poor blood supply (circulation) to your feet and legs. This may cause the skin on your feet to become thinner, break easier, and heal more slowly. Your skin may become dry, and the skin may peel and crack. You may also have nerve damage in your legs and feet causing decreased feeling in them. You may not notice minor injuries to your feet that could lead to infections or more serious problems. Taking care of your feet is one of the most important things you can do for yourself.  HOME CARE INSTRUCTIONS  Wear shoes at all times, even in the house. Do not go barefoot. Bare feet are easily injured.  Check your feet daily for blisters, cuts, and redness. If you cannot see the bottom of your feet, use a mirror or ask someone for help.  Wash your feet with warm water (do not use hot water) and mild soap. Then pat your feet and the areas between your toes until they are completely dry. Do not soak your feet as this can dry your skin.  Apply a moisturizing lotion or petroleum jelly (that does not contain alcohol and is unscented) to the skin on your feet and to dry, brittle toenails. Do not apply lotion between your toes.  Trim your toenails straight across. Do not dig under them or around the cuticle. File the edges of your nails with an emery board or nail file.  Do not cut corns or calluses or try to remove them with medicine.  Wear clean socks or stockings every day. Make sure they are not too tight. Do not wear knee-high stockings since they may decrease blood flow to your legs.  Wear shoes that fit properly and have enough cushioning. To break in new shoes, wear them for just a few hours a day. This prevents you from injuring your feet. Always look in your shoes before you put them on to be sure there are no objects inside.  Do not cross your legs. This may decrease the blood flow to your feet.  If you find a minor scrape,  cut, or break in the skin on your feet, keep it and the skin around it clean and dry. These areas may be cleansed with mild soap and water. Do not cleanse the area with peroxide, alcohol, or iodine.  When you remove an adhesive bandage, be sure not to damage the skin around it.  If you have a wound, look at it several times a day to make sure it is healing.  Do not use heating pads or hot water bottles. They may burn your skin. If you have lost feeling in your feet or legs, you may not know it is happening until it is too late.  Make sure your health care provider performs a complete foot exam at least annually or more often if you have foot problems. Report any cuts, sores, or bruises to your health care provider immediately. SEEK MEDICAL CARE IF:   You have an injury that is not healing.  You have cuts or breaks in the skin.  You have an ingrown nail.  You notice redness on your legs or feet.  You feel burning or tingling in your legs or feet.  You have pain or cramps in your legs and feet.  Your legs or feet are numb.  Your feet always feel cold. SEEK IMMEDIATE MEDICAL CARE IF:   There is increasing redness,   swelling, or pain in or around a wound.  There is a red line that goes up your leg.  Pus is coming from a wound.  You develop a fever or as directed by your health care provider.  You notice a bad smell coming from an ulcer or wound. Document Released: 07/26/2000 Document Revised: 03/31/2013 Document Reviewed: 01/05/2013 ExitCare Patient Information 2015 [employer]. This information is not intended to replace advice given to you by your health care provider. Make sure you discuss any questions you have with your health care provider.  

## 2014-12-05 NOTE — Progress Notes (Signed)
   Subjective:    Patient ID: Jackson Parks, male    DOB: Apr 26, 1941, 74 y.o.   MRN: [id number]  HPI Comments: "I have pain on top of the foot and the ankle"  Patient c/o sharp, shooting sensations anterior ankle and dorsal left for about 6 months. No injury. Episodes of pain. No swelling. Getting worse. Tried Ibuprofen-some help.  He also is concerned about the 1st toenail right-discolored.  Diabetic since 2008. Last A1C was 7.2.     Review of Systems  Musculoskeletal: Positive for arthralgias and gait problem.  All other systems reviewed and are negative.      Objective:   Physical Exam: I have reviewed his past medical history medications allergies surgery social history. Pulses are palpable bilateral. Neurologic sensorium is intact percent was C monofilament. Deep tendon reflexes are intact bilaterally muscle strength is 5 over 5 dorsiflexion plantar flexors and inverters everters all intrinsic musculature is intact. Orthopedic evaluation and streets DISTAL to the ankle for range of motion without crepitation. Except for follow-up plane range of motion which is painful on inversion as well as eversion and Lisfranc's joint area of his left foot. There is no overlying erythema edema saline as drainage or odor however radiographically does demonstrate osteoarthritic changes in these joints area        Assessment & Plan:  Assessment: Capsulitis osteoarthritis Lisfranc's joints left foot.  Plan: Injected the area today with Kenalog and local anesthetic discussed the possibility of orthotics in the future.

## 2015-05-18 ENCOUNTER — Ambulatory Visit (INDEPENDENT_AMBULATORY_CARE_PROVIDER_SITE_OTHER): Payer: Medicare Other | Admitting: Physician Assistant

## 2015-05-18 ENCOUNTER — Encounter: Payer: Self-pay | Admitting: Physician Assistant

## 2015-05-18 VITALS — BP 116/64 | HR 64 | Ht 70.0 in | Wt 298.4 lb

## 2015-05-18 DIAGNOSIS — I4892 Unspecified atrial flutter: Secondary | ICD-10-CM

## 2015-05-18 DIAGNOSIS — G4733 Obstructive sleep apnea (adult) (pediatric): Secondary | ICD-10-CM

## 2015-05-18 DIAGNOSIS — I2510 Atherosclerotic heart disease of native coronary artery without angina pectoris: Secondary | ICD-10-CM

## 2015-05-18 DIAGNOSIS — E7800 Pure hypercholesterolemia, unspecified: Secondary | ICD-10-CM

## 2015-05-18 NOTE — Patient Instructions (Signed)
Your physician recommends that you schedule a follow-up appointment in: 6 MONTHS WITH DR. JORDAN  

## 2015-05-18 NOTE — Progress Notes (Signed)
Patient ID: Nikolis Berent, male   DOB: 1941/03/19, 74 y.o.   MRN: [id number]    Date:  05/18/2015   ID:  Gerik Coberly, DOB 09/07/1940, MRN [id number]  PCP:  Pcp Not In System  Primary Cardiologist:  [place]  Chief Complaint  Patient presents with  . Follow-up    2 yr.//pt states his legs ache every day, no other Sx.     History of Present Illness: Mattias Walmsley is a 73 y.o. male with a history of atrial flutter status post DC cardioversion. He subsequently had an ablation procedure by Dr. [name] in April 2014. He has a history of coronary disease and is status post stenting of the diagonal branch. Cardiac catheterization in February 2014 showed continued patency of the stent and nonobstructive disease. He also had normal right heart pressures at that time. He does have obstructive sleep apnea and apparently had a positive sleep study 10 years ago. He has had difficulty tolerating CPAP therapy in the past however, he's gotten a new sleep apnea machine and has finally able to tolerate it.  He's here for two year evaluation.  Reports doing well and has no particular complaints. He and his wife were in [place] and did a fair amount of hiking around. He had no difficulties with this with the exception of some lower extremity pain probably related to his knees and spurs.   The patient currently denies nausea, vomiting, fever, chest pain, shortness of breath, orthopnea, dizziness, PND, cough, congestion, abdominal pain, hematochezia, melena, lower extremity edema, claudication.  Wt Readings from Last 3 Encounters:  05/18/15 135.353 kg (298 lb 6.4 oz)  11/30/14 132.632 kg (292 lb 6.4 oz)  11/15/13 132.904 kg (293 lb)     Past Medical History  Diagnosis Date  . CAD (coronary artery disease)   . Hypercholesterolemia   . DM (dermatomyositis)   . Obesity   . COPD (chronic obstructive pulmonary disease) ([place])   . Atrial flutter ([place]) 10/05/2012    Current  Outpatient Prescriptions  Medication Sig Dispense Refill  . aspirin EC 81 MG tablet Take 81 mg by mouth daily.    Marland Kitchen glimepiride (AMARYL) 2 MG tablet Take 2 mg by mouth daily before breakfast.    . ibuprofen (ADVIL,MOTRIN) 200 MG tablet Take 200 mg by mouth every 6 (six) hours as needed for pain.    Marland Kitchen lisinopril (PRINIVIL,ZESTRIL) 10 MG tablet TAKE 1 TABLET BY MOUTH EVERY EVENING. 30 tablet 0  . sitaGLIPtan-metformin (JANUMET) 50-1000 MG per tablet Take 1 tablet by mouth 2 (two) times daily with a meal.    . traZODone (DESYREL) 50 MG tablet Take by mouth.     No current facility-administered medications for this visit.    Allergies:    Allergies  Allergen Reactions  . Statins     Muscle pain    Social History:  The patient  reports that he quit smoking about 14 years ago. His smoking use included Cigarettes. He has a 117 pack-year smoking history. He does not have any smokeless tobacco history on file. He reports that he drinks alcohol. He reports that he does not use illicit drugs.   Family history:   Family History  Problem Relation Age of Onset  . Heart attack Father 59    deceased  . Heart failure Mother 46    deceased  . Coronary artery disease Brother 37  . Peripheral vascular disease Brother 68    ROS:  Please see the history of  present illness.  All other systems reviewed and negative.   PHYSICAL EXAM: VS:  BP 116/64 mmHg  Pulse 64  Ht 5\' 10"  (1.778 m)  Wt 135.353 kg (298 lb 6.4 oz)  BMI 42.82 kg/m2 Obese, well developed, in no acute distress HEENT: Pupils are equal round react to light accommodation extraocular movements are intact.  Neck: no JVDNo cervical lymphadenopathy. Cardiac: Regular rate and rhythm 1/6 systolic murmur. Lungs:  clear to auscultation bilaterally, no wheezing, rhonchi or rales Abd: Tense, nontender, positive bowel sounds all quadrants,  Ext: no lower extremity edema.  2+ radial and dorsalis pedis pulses. Skin: warm and dry Neuro:  Grossly  normal  EKG:  Sinus rhythm right bundle branch block 64 bpm   ASSESSMENT AND PLAN:  Problem List Items Addressed This Visit    OSA (obstructive sleep apnea) - Primary   OBESITY, MORBID   HYPERCHOLESTEROLEMIA   Coronary atherosclerosis   Atrial flutter (HCC)        Obesity, BMI 42.82 We discussed low carb diet and daily exercise. I offered referral to a registered dietitian Atrial flutter Status post ablation in 2014 with Dr. [name]. Patient maintaining normal sinus rhythm with right bundle branch block. No changes compared to prior EKG Coronary artery disease  No complaints of angina. Obstructive sleep apnea:  Patient is using his new CPAP and doing very well.  Hyperlipidemia:  Followed by primary care provider not currently on a statin  Diabetes mellitus: Followed by primary   follow-up in 6 months

## 2015-10-02 ENCOUNTER — Encounter: Payer: Self-pay | Admitting: *Deleted

## 2015-10-02 ENCOUNTER — Emergency Department: Payer: Medicare Other

## 2015-10-02 ENCOUNTER — Emergency Department
Admission: EM | Admit: 2015-10-02 | Discharge: 2015-10-02 | Disposition: A | Discharge status: Home / Self Care | Payer: Medicare Other | Attending: Emergency Medicine | Admitting: Emergency Medicine

## 2015-10-02 DIAGNOSIS — Z7982 Long term (current) use of aspirin: Secondary | ICD-10-CM | POA: Diagnosis not present

## 2015-10-02 DIAGNOSIS — Z79899 Other long term (current) drug therapy: Secondary | ICD-10-CM | POA: Insufficient documentation

## 2015-10-02 DIAGNOSIS — R1032 Left lower quadrant pain: Secondary | ICD-10-CM | POA: Diagnosis present

## 2015-10-02 DIAGNOSIS — K5790 Diverticulosis of intestine, part unspecified, without perforation or abscess without bleeding: Secondary | ICD-10-CM | POA: Diagnosis not present

## 2015-10-02 DIAGNOSIS — E119 Type 2 diabetes mellitus without complications: Secondary | ICD-10-CM | POA: Insufficient documentation

## 2015-10-02 DIAGNOSIS — Z87891 Personal history of nicotine dependence: Secondary | ICD-10-CM | POA: Insufficient documentation

## 2015-10-02 HISTORY — DX: Type 2 diabetes mellitus without complications: E11.9

## 2015-10-02 LAB — CBC
HCT: 41.1 % (ref 40.0–52.0)
Hemoglobin: 13.8 g/dL (ref 13.0–18.0)
MCH: 31.3 pg (ref 26.0–34.0)
MCHC: 33.6 g/dL (ref 32.0–36.0)
MCV: 92.9 fL (ref 80.0–100.0)
PLATELETS: 221 10*3/uL (ref 150–440)
RBC: 4.42 MIL/uL (ref 4.40–5.90)
RDW: 14.6 % — AB (ref 11.5–14.5)
WBC: 6.2 10*3/uL (ref 3.8–10.6)

## 2015-10-02 LAB — COMPREHENSIVE METABOLIC PANEL
ALK PHOS: 27 U/L — AB (ref 38–126)
ALT: 13 U/L — AB (ref 17–63)
AST: 15 U/L (ref 15–41)
Albumin: 4.3 g/dL (ref 3.5–5.0)
Anion gap: 9 (ref 5–15)
BUN: 18 mg/dL (ref 6–20)
CALCIUM: 9.5 mg/dL (ref 8.9–10.3)
CO2: 23 mmol/L (ref 22–32)
CREATININE: 0.88 mg/dL (ref 0.61–1.24)
Chloride: 102 mmol/L (ref 101–111)
Glucose, Bld: 164 mg/dL — ABNORMAL HIGH (ref 65–99)
Potassium: 4.6 mmol/L (ref 3.5–5.1)
SODIUM: 134 mmol/L — AB (ref 135–145)
Total Bilirubin: <0.1 mg/dL — ABNORMAL LOW (ref 0.3–1.2)
Total Protein: 8 g/dL (ref 6.5–8.1)

## 2015-10-02 LAB — URINALYSIS COMPLETE WITH MICROSCOPIC (ARMC ONLY)
BILIRUBIN URINE: NEGATIVE
Bacteria, UA: NONE SEEN
GLUCOSE, UA: 50 mg/dL — AB
Hgb urine dipstick: NEGATIVE
KETONES UR: NEGATIVE mg/dL
Leukocytes, UA: NEGATIVE
Nitrite: NEGATIVE
PH: 5 (ref 5.0–8.0)
Protein, ur: 30 mg/dL — AB
RBC / HPF: NONE SEEN RBC/hpf (ref 0–5)
SQUAMOUS EPITHELIAL / LPF: NONE SEEN
Specific Gravity, Urine: 1.017 (ref 1.005–1.030)

## 2015-10-02 LAB — TROPONIN I
TROPONIN I: 0.03 ng/mL (ref <0.031)
Troponin I: 0.06 ng/mL — ABNORMAL HIGH (ref <0.031)

## 2015-10-02 LAB — LIPASE, BLOOD: Lipase: 25 U/L (ref 11–51)

## 2015-10-02 MED ORDER — METRONIDAZOLE 500 MG PO TABS
500.0000 mg | ORAL_TABLET | Freq: Two times a day (BID) | ORAL | Status: AC
Start: 2015-10-02 — End: 2015-10-11

## 2015-10-02 MED ORDER — MORPHINE SULFATE (PF) 2 MG/ML IV SOLN
2.0000 mg | Freq: Once | INTRAVENOUS | Status: AC
Start: 2015-10-02 — End: 2015-10-02
  Administered 2015-10-02: 2 mg via INTRAVENOUS
  Filled 2015-10-02: qty 1

## 2015-10-02 MED ORDER — METRONIDAZOLE 500 MG PO TABS
500.0000 mg | ORAL_TABLET | Freq: Once | ORAL | Status: AC
  Administered 2015-10-02: 500 mg via ORAL

## 2015-10-02 MED ORDER — METRONIDAZOLE 500 MG PO TABS
ORAL_TABLET | ORAL | Status: AC
  Filled 2015-10-02: qty 1

## 2015-10-02 MED ORDER — CIPROFLOXACIN HCL 500 MG PO TABS
500.0000 mg | ORAL_TABLET | Freq: Once | ORAL | Status: AC
  Administered 2015-10-02: 500 mg via ORAL

## 2015-10-02 MED ORDER — IOHEXOL 240 MG/ML SOLN
25.0000 mL | Freq: Once | INTRAMUSCULAR | Status: AC | PRN
  Administered 2015-10-02: 25 mL via ORAL

## 2015-10-02 MED ORDER — CIPROFLOXACIN HCL 500 MG PO TABS: 500.0000 mg | ORAL_TABLET | Freq: Two times a day (BID) | ORAL | Status: AC

## 2015-10-02 MED ORDER — CIPROFLOXACIN HCL 500 MG PO TABS
ORAL_TABLET | ORAL | Status: AC
  Filled 2015-10-02: qty 1

## 2015-10-02 MED ORDER — IOHEXOL 300 MG/ML  SOLN
100.0000 mL | Freq: Once | INTRAMUSCULAR | Status: AC | PRN
  Administered 2015-10-02: 100 mL via INTRAVENOUS

## 2015-10-02 MED ORDER — ONDANSETRON HCL 4 MG/2ML IJ SOLN
4.0000 mg | Freq: Once | INTRAMUSCULAR | Status: AC
  Administered 2015-10-02: 4 mg via INTRAVENOUS
  Filled 2015-10-02: qty 2

## 2015-10-02 NOTE — ED Notes (Signed)
AAOx3.  Skin warm and dry. NAD.  Ambulates with easy and steady gait.   

## 2015-10-02 NOTE — ED Notes (Signed)
Critical troponin 0.06 Dr Owens Shark notified

## 2015-10-02 NOTE — ED Provider Notes (Signed)
[facility] Emergency Department Provider Note  ____________________________________________  Time seen: 12:05 AM  I have reviewed the triage vital signs and the nursing notes.   HISTORY  Chief Complaint Abdominal Pain      HPI Jackson Parks is a 75 y.o. male with history of CAD COPD atrial flutter and diabetes presents with history of accidental trip and fall on Thursday. Patient states that he caught himself and as such did not hit the floor no head injury. Patient now presents this morning with left lower quadrant abdominal pain that is currently 8 out of 10 nonradiating no nausea or vomiting. Patient denies any fever no constipation or diarrhea. Patient denies any urinary symptoms.     Past Medical History  Diagnosis Date  . CAD (coronary artery disease)   . Hypercholesterolemia   . DM (dermatomyositis)   . Obesity   . COPD (chronic obstructive pulmonary disease) ([place])   . Atrial flutter ([place]) 10/05/2012    Patient Active Problem List   Diagnosis Date Noted  . OSA (obstructive sleep apnea) 04/05/2013  . Chest pain, mid sternal 10/06/2012  . Atrial flutter ([place]) 10/05/2012  . DM 07/31/2009  . HYPERCHOLESTEROLEMIA 07/31/2009  . OBESITY, MORBID 07/31/2009  . Coronary atherosclerosis 07/31/2009  . COPD 07/31/2009    Past Surgical History  Procedure Laterality Date  . Plantar fasciectomy  2005  . Skin lesions      multiple removed  . Steting      stenting of the diagonal branch using a 2.5 x 23 Cypher drug-eluting stent followed by post dilatation  . Tee without cardioversion N/A 10/12/2012    Procedure: TRANSESOPHAGEAL ECHOCARDIOGRAM (TEE);  Surgeon: [name], MD;  Location: [facility] ENDOSCOPY;  Service: Cardiovascular;  Laterality: N/A;  . Cardioversion N/A 10/12/2012    Procedure: CARDIOVERSION;  Surgeon: [name], MD;  Location: [facility] ENDOSCOPY;  Service: Cardiovascular;  Laterality: N/A;  . Left heart catheterization with  coronary angiogram N/A 10/05/2012    Procedure: LEFT HEART CATHETERIZATION WITH CORONARY ANGIOGRAM;  Surgeon: Peter M Martinique, MD;  Location: [facility] CATH LAB;  Service: Cardiovascular;  Laterality: N/A;  . A flutter ablation N/A 11/26/2012    Procedure: ABLATION A FLUTTER;  Surgeon: [name], MD;  Location[facility] C[facility] LAB;  Service: Cardiovascular;  Laterality: N/A;    Current Outpatient Rx  Name  Route  Sig  Dispense  Refill  . aspirin EC 81 MG tablet   Oral   Take 81 mg by mouth daily.         Marland Kitchen glimepiride (AMARYL) 2 MG tablet   Oral   Take 2 mg by mouth daily before breakfast.         . ibuprofen (ADVIL,MOTRIN) 200 MG tablet   Oral   Take 200 mg by mouth every 6 (six) hours as needed for pain.         Marland Kitchen lisinopril (PRINIVIL,ZESTRIL) 10 MG tablet      TAKE 1 TABLET BY MOUTH EVERY EVENING.   30 tablet   0     **PATIENT NEEDS APPOINTMENT**   . sitaGLIPtan-metformin (JANUMET) 50-1000 MG per tablet   Oral   Take 1 tablet by mouth 2 (two) times daily with a meal.         . traZODone (DESYREL) 50 MG tablet   Oral   Take by mouth.           Allergies Statins  Family History  Problem Relation Age of Onset  .  Heart attack Father 96    deceased  . Heart failure Mother 17    deceased  . Coronary artery disease Brother 63  . Peripheral vascular disease Brother 1    Social History Social History  Substance Use Topics  . Smoking status: Former Smoker -- 3.00 packs/day for 39 years    Types: Cigarettes    Quit date: 12/23/2000  . Smokeless tobacco: None  . Alcohol Use: Yes     Comment: two margaritas per year    Review of Systems  Constitutional: Negative for fever. Eyes: Negative for visual changes. ENT: Negative for sore throat. Cardiovascular: Negative for chest pain. Respiratory: Negative for shortness of breath. Gastrointestinal: Positive for left lower quadrant abdominal pain Genitourinary: Negative for dysuria. Musculoskeletal: Negative for  back pain. Skin: Negative for rash. Neurological: Negative for headaches, focal weakness or numbness.   10-point ROS otherwise negative.  ____________________________________________   PHYSICAL EXAM:  VITAL SIGNS: ED Triage Vitals  Enc Vitals Group     BP 10/02/15 0943 138/65 mmHg     Pulse Rate 10/02/15 0943 67     Resp 10/02/15 0943 18     Temp 10/02/15 0943 98.2 F (36.8 C)     Temp Source 10/02/15 0943 Oral     SpO2 10/02/15 0943 95 %     Weight 10/02/15 0943 290 lb (131.543 kg)     Height 10/02/15 0943 5\' 11"  (1.803 m)     Head Cir --      Peak Flow --      Pain Score 10/02/15 0957 6     Pain Loc --      Pain Edu? --      Excl. in [place]? --      Constitutional: Alert and oriented. Well appearing and in no distress. Eyes: Conjunctivae are normal. PERRL. Normal extraocular movements. ENT   Head: Normocephalic and atraumatic.   Nose: No congestion/rhinnorhea.   Mouth/Throat: Mucous membranes are moist.   Neck: No stridor. Hematological/Lymphatic/Immunilogical: No cervical lymphadenopathy. Cardiovascular: Normal rate, regular rhythm. Normal and symmetric distal pulses are present in all extremities. No murmurs, rubs, or gallops. Respiratory: Normal respiratory effort without tachypnea nor retractions. Breath sounds are clear and equal bilaterally. No wheezes/rales/rhonchi. Gastrointestinal: Left lower quadrant tenderness to palpation. No distention. There is no CVA tenderness. Genitourinary: deferred Musculoskeletal: Nontender with normal range of motion in all extremities. No joint effusions.  No lower extremity tenderness nor edema. Neurologic:  Normal speech and language. No gross focal neurologic deficits are appreciated. Speech is normal.  Skin:  Skin is warm, dry and intact. No rash noted. Psychiatric: Mood and affect are normal. Speech and behavior are normal. Patient exhibits appropriate insight and  judgment.  ____________________________________________    LABS (pertinent positives/negatives)  Labs Reviewed  COMPREHENSIVE METABOLIC PANEL - Abnormal; Notable for the following:    Sodium 134 (*)    Glucose, Bld 164 (*)    ALT 13 (*)    Alkaline Phosphatase 27 (*)    Total Bilirubin <0.1 (*)    All other components within normal limits  CBC - Abnormal; Notable for the following:    RDW 14.6 (*)    All other components within normal limits  URINALYSIS COMPLETEWITH MICROSCOPIC ([facility] ONLY) - Abnormal; Notable for the following:    Color, Urine YELLOW (*)    APPearance CLEAR (*)    Glucose, UA 50 (*)    Protein, ur 30 (*)    All other components within normal limits  TROPONIN I - Abnormal; Notable for the following:    Troponin I 0.06 (*)    All other components within normal limits  LIPASE, BLOOD     ____________________________________________   EKG  ED ECG REPORT I, BROWN, Elmore N, the attending physician, personally viewed and interpreted this ECG.   Date: 10/02/2015  EKG Time: 10:00AM  Rate: 66  Rhythm:normal sinus rhythm Axis: normal  Intervals:normal  ST&T Change: none   ____________________________________________    RADIOLOGY     CT Abdomen Pelvis W Contrast (Final result) Result time: 10/02/15 13:22:31   Final result by Rad Results In Interface (10/02/15 13:22:31)   Narrative:   CLINICAL DATA: Left lower quadrant pain for 3 days, no nausea or vomiting  EXAM: CT ABDOMEN AND PELVIS WITH CONTRAST  TECHNIQUE: Multidetector CT imaging of the abdomen and pelvis was performed using the standard protocol following bolus administration of intravenous contrast.  CONTRAST: 169mL OMNIPAQUE IOHEXOL 300 MG/ML SOLN  COMPARISON: None.  FINDINGS: Sagittal images of the spine shows degenerative changes thoracolumbar spine. Significant disc space flattening with vacuum disc phenomenon at L5-S1 level. There is about 7.5 mm anterolisthesis  L5 on S1 vertebral body. Bilateral pars defect at L5 level.  The lung bases are unremarkable. Mild fatty infiltration of the liver. No focal hepatic mass. Gallbladder is contracted without evidence of calcified gallstones. Enhanced pancreas, spleen and adrenal glands are unremarkable. Minimal thickening of left adrenal gland without focal mass. Atherosclerotic calcifications are noted abdominal aorta, celiac trunk and SMA origin, bilateral renal artery origin, bilateral common iliac arteries.  Kidneys are symmetrical in size and enhancement. No hydronephrosis or hydroureter. Delayed renal images shows bilateral renal symmetrical excretion. Bilateral visualized proximal ureter is unremarkable.  There is no small bowel obstruction. Minimal distended small bowel loops with some air-fluid level in distal small bowel most likely from mild ileus or enteritis. There is no transition point in caliber of small bowel. Moderate stool noted in right colon and transverse colon. Normal appendix. No pericecal inflammation. The terminal ileum is unremarkable.  Scattered diverticula are noted descending colon. Few diverticula are noted in proximal sigmoid colon. There is no evidence of acute diverticulitis or colitis or mild redundant sigmoid colon. Minimal gaseous distension of mid sigmoid colon please see axial image 52. No distal colonic obstruction. Prostate gland and seminal vesicles are unremarkable. A penile reservoir implant is noted in mid pelvis anteriorly just inferior to anterior aspect of the urinary bladder. Partially visualized penile implant. There is a small umbilical hernia containing fat measures 1.8 x 2.4 cm without use of acute complication.  IMPRESSION: 1. Degenerative changes lumbar spine as described above. Bilateral pars defect at L5 level. 2. Mild fatty infiltration of the liver. 3. No hydronephrosis or hydroureter. 4. There is a small umbilical hernia containing fat  without evidence of acute complication measures 1.8 by 2.4 cm. 5. Minimal distended distal small bowel loops with some air-fluid levels most likely mild ileus or enteritis. No transition point in caliber of small bowel. 6. Normal appendix. No pericecal inflammation. 7. Few diverticula are noted descending colon and proximal sigmoid colon. Mild redundant sigmoid colon. Some colonic gas is noted in mid sigmoid colon. No evidence of distal colonic obstruction. No evidence of acute colitis or diverticulitis. 8. 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   Electronically Signed By: Lahoma Crocker M.D. On: 10/02/2015 13:22         INITIAL IMPRESSION / ASSESSMENT AND PLAN / ED COURSE  Pertinent labs &  imaging results that were available during my care of the patient were reviewed by me and considered in my medical decision making (see chart for details).  History of physical exam concerning for possible diverticulitis not confirmed on CT. 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

## 2015-10-02 NOTE — Discharge Instructions (Signed)
Diverticulosis Diverticulosis is the condition that develops when small pouches (diverticula) form in the wall of your colon. Your colon, or large intestine, is where water is absorbed and stool is formed. The pouches form when the inside layer of your colon pushes through weak spots in the outer layers of your colon. CAUSES  No one knows exactly what causes diverticulosis. RISK FACTORS  Being older than 50. Your risk for this condition increases with age. Diverticulosis is rare in people younger than 40 years. By age 80, almost everyone has it.  Eating a low-fiber diet.  Being frequently constipated.  Being overweight.  Not getting enough exercise.  Smoking.  Taking over-the-counter pain medicines, like aspirin and ibuprofen. SYMPTOMS  Most people with diverticulosis do not have symptoms. DIAGNOSIS  Because diverticulosis often has no symptoms, health care providers often discover the condition during an exam for other colon problems. In many cases, a health care provider will diagnose diverticulosis while using a flexible scope to examine the colon (colonoscopy). TREATMENT  If you have never developed an infection related to diverticulosis, you may not need treatment. If you have had an infection before, treatment may include:  Eating more fruits, vegetables, and grains.  Taking a fiber supplement.  Taking a live bacteria supplement (probiotic).  Taking medicine to relax your colon. HOME CARE INSTRUCTIONS   Drink at least 6-8 glasses of water each day to prevent constipation.  Try not to strain when you have a bowel movement.  Keep all follow-up appointments. If you have had an infection before:  Increase the fiber in your diet as directed by your health care provider or dietitian.  Take a dietary fiber supplement if your health care provider approves.  Only take medicines as directed by your health care provider. SEEK MEDICAL CARE IF:   You have abdominal  pain.  You have bloating.  You have cramps.  You have not gone to the bathroom in 3 days. SEEK IMMEDIATE MEDICAL CARE IF:   Your pain gets worse.  Yourbloating becomes very bad.  You have a fever or chills, and your symptoms suddenly get worse.  You begin vomiting.  You have bowel movements that are bloody or black. MAKE SURE YOU:  Understand these instructions.  Will watch your condition.  Will get help right away if you are not doing well or get worse.   This information is not intended to replace advice given to you by your health care provider. Make sure you discuss any questions you have with your health care provider.   Document Released: 04/25/2004 Document Revised: 08/03/2013 Document Reviewed: 06/23/2013 Elsevier Interactive Patient Education 2016 [employer]  

## 2015-10-02 NOTE — ED Notes (Signed)
Pt tripped and fell Friday, pt denies hitting head, pt reports left lower quad abdominal pain started, pt denies chest pain or any other symptoms

## 2015-11-30 ENCOUNTER — Ambulatory Visit: Payer: No Typology Code available for payment source | Admitting: Pulmonary Disease

## 2015-12-14 ENCOUNTER — Ambulatory Visit (INDEPENDENT_AMBULATORY_CARE_PROVIDER_SITE_OTHER)
Admission: RE | Admit: 2015-12-14 | Discharge: 2015-12-14 | Disposition: A | Discharge status: Home / Self Care | Payer: Medicare Other | Source: Ambulatory Visit | Attending: Pulmonary Disease | Admitting: Pulmonary Disease

## 2015-12-14 ENCOUNTER — Encounter: Payer: Self-pay | Admitting: Pulmonary Disease

## 2015-12-14 ENCOUNTER — Ambulatory Visit (INDEPENDENT_AMBULATORY_CARE_PROVIDER_SITE_OTHER): Payer: Medicare Other | Admitting: Pulmonary Disease

## 2015-12-14 VITALS — BP 128/78 | HR 71 | Ht 71.0 in | Wt 296.0 lb

## 2015-12-14 DIAGNOSIS — J449 Chronic obstructive pulmonary disease, unspecified: Secondary | ICD-10-CM | POA: Diagnosis not present

## 2015-12-14 DIAGNOSIS — R0600 Dyspnea, unspecified: Secondary | ICD-10-CM

## 2015-12-14 DIAGNOSIS — G4733 Obstructive sleep apnea (adult) (pediatric): Secondary | ICD-10-CM | POA: Diagnosis not present

## 2015-12-14 DIAGNOSIS — J439 Emphysema, unspecified: Secondary | ICD-10-CM | POA: Diagnosis not present

## 2015-12-14 MED ORDER — TIOTROPIUM BROMIDE MONOHYDRATE 1.25 MCG/ACT IN AERS: 2.0000 | INHALATION_SPRAY | Freq: Every day | RESPIRATORY_TRACT | Status: DC

## 2015-12-14 NOTE — Assessment & Plan Note (Addendum)
100 PY, quit in 2001.  Likely with mod COPD. More SOB recently.  Plan : 1. Spiriva respimat 2P daily.  2. PFT, CXR. 3. Pt will call if SOB worse before f/u. Will consider adding symbicort. (has aflutter).

## 2015-12-14 NOTE — Progress Notes (Signed)
Subjective:    Patient ID: Jackson Parks, male    DOB: Mar 17, 1941, 75 y.o.   MRN: [id number]  HPI  Pt returns to office as f/u on his OSA.  ROV (12/14/15) pt is here for f/u on his osa. Uses cpap. No issues. 100 PY, quit in 2001. SOB with more than ADLs. Worse the last yr. Significant smoking history. Also gained weight. Sees heart MD as well.    Review of Systems  Constitutional: Negative.   HENT: Negative.   Eyes: Negative.   Respiratory: Positive for cough and shortness of breath.   Cardiovascular: Negative.   Gastrointestinal: Negative.   Endocrine: Negative.   Genitourinary: Negative.   Musculoskeletal: Negative.   Skin: Negative.   Allergic/Immunologic: Negative.   Neurological: Negative.   Hematological: Negative.   Psychiatric/Behavioral: Negative.   All other systems reviewed and are negative.  Past Medical History  Diagnosis Date  . CAD (coronary artery disease)   . Hypercholesterolemia   . DM (dermatomyositis)   . Obesity   . COPD (chronic obstructive pulmonary disease) ([place])   . Atrial flutter ([place]) 10/05/2012  . Diabetes mellitus without complication (HCC)    (-) CA, DVT  Family History  Problem Relation Age of Onset  . Heart attack Father 30    deceased  . Heart failure Mother 46    deceased  . Coronary artery disease Brother 27  . Peripheral vascular disease Brother 64     Past Surgical History  Procedure Laterality Date  . Plantar fasciectomy  2005  . Skin lesions      multiple removed  . Steting      stenting of the diagonal branch using a 2.5 x 23 Cypher drug-eluting stent followed by post dilatation  . Tee without cardioversion N/A 10/12/2012    Procedure: TRANSESOPHAGEAL ECHOCARDIOGRAM (TEE);  Surgeon: [name], MD;  Location[facility] ENDOSCOPY;  Service: Cardiovascular;  Laterality: N/A;  . Cardioversion N/A 10/12/2012    Procedure: CARDIOVERSION;  Surgeon: [name], MD;  Location: [facility] ENDOSCOPY;  Service: Cardiovascular;   Laterality: N/A;  . Left heart catheterization with coronary angiogram N/A 10/05/2012    Procedure: LEFT HEART CATHETERIZATION WITH CORONARY ANGIOGRAM;  Surgeon: Peter M Martinique, MD;  Location: [facility] CATH LAB;  Service: Cardiovascular;  Laterality: N/A;  . A flutter ablation N/A 11/26/2012    Procedure: ABLATION A FLUTTER;  Surgeon: [name], MD;  Location: [facility] CATH LAB;  Service: Cardiovascular;  Laterality: N/A;    Social History   Social History  . Marital Status: Married    Spouse Name: N/A  . Number of Children: 2  . Years of Education: N/A   Occupational History  . truck driver-RETIRED    Social History Main Topics  . Smoking status: Former Smoker -- 3.00 packs/day for 39 years    Types: Cigarettes    Quit date: 12/23/2000  . Smokeless tobacco: Not on file  . Alcohol Use: Yes     Comment: two margaritas per year  . Drug Use: No  . Sexual Activity: Not on file   Other Topics Concern  . Not on file   Social History Narrative     Allergies  Allergen Reactions  . Statins     Muscle pain     Outpatient Prescriptions Prior to Visit  Medication Sig Dispense Refill  . aspirin EC 81 MG tablet Take 81 mg by mouth daily.    Marland Kitchen glimepiride (AMARYL) 2 MG tablet Take 2 mg  by mouth daily before breakfast.    . lisinopril (PRINIVIL,ZESTRIL) 10 MG tablet TAKE 1 TABLET BY MOUTH EVERY EVENING. 30 tablet 0  . sitaGLIPtan-metformin (JANUMET) 50-1000 MG per tablet Take 1 tablet by mouth 2 (two) times daily with a meal.    . traZODone (DESYREL) 50 MG tablet Take by mouth.    Marland Kitchen ibuprofen (ADVIL,MOTRIN) 200 MG tablet Take 200 mg by mouth every 6 (six) hours as needed for pain.     No facility-administered medications prior to visit.   Meds ordered this encounter  Medications  . acetaminophen (TYLENOL) 500 MG tablet    Sig: Take 500 mg by mouth every 6 (six) hours as needed.  . Tiotropium Bromide Monohydrate (SPIRIVA RESPIMAT) 1.25 MCG/ACT AERS    Sig: Inhale 2 puffs into the lungs  daily.    Dispense:  1 Inhaler    Refill:  5          Objective:   Physical Exam  Vitals:  Filed Vitals:   12/14/15 1053  BP: 128/78  Pulse: 71  Height: 5\' 11"  (1.803 m)  Weight: 296 lb (134.265 kg)  SpO2: 93%    Constitutional/General:  Pleasant, well-nourished, well-developed, not in any distress,  Comfortably seating.  Well kempt  Body mass index is 41.3 kg/(m^2). Wt Readings from Last 3 Encounters:  12/14/15 296 lb (134.265 kg)  10/02/15 290 lb (131.543 kg)  05/18/15 298 lb 6.4 oz (135.353 kg)     HEENT: Pupils equal and reactive to light and accommodation. Anicteric sclerae. Normal nasal mucosa.   No oral  lesions,  mouth clear,  oropharynx clear, no postnasal drip. (-) Oral thrush. No dental caries.  Airway - Mallampati class IV  Neck: No masses. Midline trachea. No JVD, (-) LAD. (-) bruits appreciated.  Respiratory/Chest: Grossly normal chest. (-) deformity. (-) Accessory muscle use.  Symmetric expansion. (-) Tenderness on palpation.  Resonant on percussion.  Diminished BS on both lower lung zones. (-) wheezing, crackles, rhonchi (-) egophony  Cardiovascular: Regular rate and  rhythm, heart sounds normal, no murmur or gallops, Gr 1  edema  Gastrointestinal:  Normal bowel sounds. Soft, non-tender. No hepatosplenomegaly.  (-) masses.   Musculoskeletal:  Normal muscle tone. Normal gait.   Extremities: Grossly normal. (-) clubbing, cyanosis.  (+) Gr 1 edema  Skin: (-) rash,lesions seen.   Neurological/Psychiatric : alert, oriented to time, place, person. Normal mood and affect            Assessment & Plan:  OSA (obstructive sleep apnea) Auto 5-15cm 06/2013:  Optimal pressure right at 15cm, adequate control of osa, but significant mask leak.  New machine 10/2014:  Download with great control of AHI and no mask leaks.  Uses cpap. More energy. Less sleepiness. Feels benefit of cpap. DL the last month  100%, AHI 1.2  Cont cpap.   COPD  (chronic obstructive pulmonary disease) with emphysema ([place]) 100 PY, quit in 2001.  Likely with mod COPD. More SOB recently.  Plan : 1. Spiriva respimat 2P daily.  2. PFT, CXR. 3. Pt will call if SOB worse before f/u. Will consider adding symbicort. (has aflutter).   OBESITY, MORBID Weight reduction   Return to clinic in 3-4 mos.   Monica Becton, MD 12/14/2015, 12:28 PM [facility] Pager [phone] After 3 pm or if no answer, call [phone]

## 2015-12-14 NOTE — Assessment & Plan Note (Signed)
Weight reduction 

## 2015-12-14 NOTE — Assessment & Plan Note (Addendum)
Auto 5-15cm 06/2013:  Optimal pressure right at 15cm, adequate control of osa, but significant mask leak.  New machine 10/2014:  Download with great control of AHI and no mask leaks.  Uses cpap. More energy. Less sleepiness. Feels benefit of cpap. DL the last month  100%, AHI 1.2  Cont cpap.

## 2015-12-14 NOTE — Patient Instructions (Signed)
1. Start Stiolto 2 puffs daily.  2. We will order a breathing test and chest Xray. 3. Call the office if breathing is worse.  Return to clinic in 3-4 mos.

## 2015-12-21 ENCOUNTER — Ambulatory Visit (HOSPITAL_COMMUNITY)
Admission: RE | Admit: 2015-12-21 | Discharge: 2015-12-21 | Disposition: A | Discharge status: Home / Self Care | Payer: Medicare Other | Source: Ambulatory Visit | Attending: Pulmonary Disease | Admitting: Pulmonary Disease

## 2015-12-21 DIAGNOSIS — J449 Chronic obstructive pulmonary disease, unspecified: Secondary | ICD-10-CM | POA: Diagnosis present

## 2015-12-21 LAB — PULMONARY FUNCTION TEST
DL/VA % pred: 94 %
DL/VA: 4.23 ml/min/mmHg/L
DLCO unc % pred: 77 %
DLCO unc: 22.94 ml/min/mmHg
FEF 25-75 PRE: 1.84 L/s
FEF 25-75 Post: 3.98 L/sec
FEF2575-%Change-Post: 116 %
FEF2575-%PRED-PRE: 85 %
FEF2575-%Pred-Post: 184 %
FEV1-%Change-Post: 34 %
FEV1-%PRED-POST: 110 %
FEV1-%PRED-PRE: 82 %
FEV1-POST: 3.23 L
FEV1-PRE: 2.39 L
FEV1FVC-%Change-Post: 26 %
FEV1FVC-%PRED-PRE: 91 %
FEV6-%CHANGE-POST: 13 %
FEV6-%PRED-POST: 101 %
FEV6-%Pred-Pre: 89 %
FEV6-POST: 3.82 L
FEV6-Pre: 3.37 L
FEV6FVC-%PRED-POST: 106 %
FEV6FVC-%Pred-Pre: 106 %
FVC-%Change-Post: 6 %
FVC-%Pred-Post: 95 %
FVC-%Pred-Pre: 89 %
FVC-PRE: 3.57 L
FVC-Post: 3.82 L
PRE FEV1/FVC RATIO: 67 %
Post FEV1/FVC ratio: 85 %
Post FEV6/FVC ratio: 100 %
Pre FEV6/FVC Ratio: 100 %
RV % PRED: 99 %
RV: 2.37 L
TLC % PRED: 108 %
TLC: 7.22 L

## 2015-12-21 MED ORDER — ALBUTEROL SULFATE (2.5 MG/3ML) 0.083% IN NEBU
2.5000 mg | INHALATION_SOLUTION | Freq: Once | RESPIRATORY_TRACT | Status: AC
  Administered 2015-12-21: 2.5 mg via RESPIRATORY_TRACT

## 2016-01-04 ENCOUNTER — Encounter: Payer: Self-pay | Admitting: Pulmonary Disease

## 2016-01-18 ENCOUNTER — Encounter: Payer: Self-pay | Admitting: Cardiology

## 2016-01-23 ENCOUNTER — Ambulatory Visit (INDEPENDENT_AMBULATORY_CARE_PROVIDER_SITE_OTHER): Payer: Medicare Other | Admitting: Cardiology

## 2016-01-23 ENCOUNTER — Encounter: Payer: Self-pay | Admitting: Cardiology

## 2016-01-23 VITALS — BP 124/64 | HR 72 | Ht 70.0 in | Wt 294.0 lb

## 2016-01-23 DIAGNOSIS — I2510 Atherosclerotic heart disease of native coronary artery without angina pectoris: Secondary | ICD-10-CM | POA: Diagnosis not present

## 2016-01-23 DIAGNOSIS — E7800 Pure hypercholesterolemia, unspecified: Secondary | ICD-10-CM

## 2016-01-23 DIAGNOSIS — I483 Typical atrial flutter: Secondary | ICD-10-CM

## 2016-01-23 MED ORDER — ROSUVASTATIN CALCIUM 5 MG PO TABS: ORAL_TABLET | ORAL | Status: DC

## 2016-01-23 NOTE — Patient Instructions (Signed)
Continue your current therapy  We will add Crestor 5 mg three days a week  Let me know if you have any side effects.  I will see you in 6 months.

## 2016-01-23 NOTE — Progress Notes (Signed)
Patient ID: Jackson Parks, male   DOB: 02/21/1941, 75 y.o.   MRN: [id number]    Date:  01/23/2016   ID:  Jackson Parks, DOB 23-May-1941, MRN [id number]  PCP:  Marcello Fennel, MD  Primary Cardiologist:  Martinique  Chief Complaint  Patient presents with  . Follow-up    no complaints      History of Present Illness: Jackson Parks is a 75 y.o. male with a history of atrial flutter status post DC cardioversion. He subsequently had an ablation procedure by Dr. [name] in April 2014. He has a history of coronary disease and is status post stenting of the diagonal branch in 2006. Cardiac catheterization in February 2014 showed continued patency of the stent and nonobstructive disease. He also had normal right heart pressures at that time. He does have obstructive sleep apnea and is on CPAP therapy.   On follow up today he reports doing well and has no particular complaints. He denies any chest pain, palpitations, or SOB. He is very active and does a lot of walking. His blood sugars are up and down. Weight is unfortunately stable. He was admitted in February with diverticulitis. He reports intolerance to statins- zocor 40 mg and pravastatin 40 mg- due to myalgias.     Wt Readings from Last 3 Encounters:  01/23/16 294 lb (133.358 kg)  12/14/15 296 lb (134.265 kg)  10/02/15 290 lb (131.543 kg)     Past Medical History  Diagnosis Date  . CAD (coronary artery disease)   . Hypercholesterolemia   . DM (dermatomyositis)   . Obesity   . COPD (chronic obstructive pulmonary disease) ([place])   . Atrial flutter ([place]) 10/05/2012  . Diabetes mellitus without complication [facility])     Current Outpatient Prescriptions  Medication Sig Dispense Refill  . acetaminophen (TYLENOL) 500 MG tablet Take 500 mg by mouth every 6 (six) hours as needed.    Marland Kitchen aspirin EC 81 MG tablet Take 81 mg by mouth daily.    Marland Kitchen glimepiride (AMARYL) 2 MG tablet Take 2 mg by mouth daily before breakfast.    . lisinopril  (PRINIVIL,ZESTRIL) 10 MG tablet TAKE 1 TABLET BY MOUTH EVERY EVENING. 30 tablet 0  . sitaGLIPtan-metformin (JANUMET) 50-1000 MG per tablet Take 1 tablet by mouth 2 (two) times daily with a meal.    . Tiotropium Bromide Monohydrate (SPIRIVA RESPIMAT) 1.25 MCG/ACT AERS Inhale 2 puffs into the lungs daily. 1 Inhaler 5  . traZODone (DESYREL) 50 MG tablet Take by mouth.    . rosuvastatin (CRESTOR) 5 MG tablet Take 5 mg 3 times a week  Mon-Wed-Fri 90 tablet 3   No current facility-administered medications for this visit.    Allergies:    Allergies  Allergen Reactions  . Statins     Muscle pain    Social History:  The patient  reports that he quit smoking about 15 years ago. His smoking use included Cigarettes. He has a 117 pack-year smoking history. He does not have any smokeless tobacco history on file. He reports that he drinks alcohol. He reports that he does not use illicit drugs.   Family history:   Family History  Problem Relation Age of Onset  . Heart attack Father 76    deceased  . Heart failure Mother 17    deceased  . Coronary artery disease Brother 60  . Peripheral vascular disease Brother 68    ROS:  Please see the history of present illness.  All other  systems reviewed and negative.   PHYSICAL EXAM: VS:  BP 124/64 mmHg  Pulse 72  Ht 5\' 10"  (1.778 m)  Wt 294 lb (133.358 kg)  BMI 42.18 kg/m2 Obese, well developed, obese WM in no acute distress HEENT: Pupils are equal round react to light accommodation extraocular movements are intact.  Neck: no JVDNo cervical lymphadenopathy. Cardiac: Regular rate and rhythm 1/6 systolic murmur at the RUSB. Lungs:  clear to auscultation bilaterally, no wheezing, rhonchi or rales Abd: Soft nontender, positive bowel sounds all quadrants,  Ext: no lower extremity edema.  2+ radial and dorsalis pedis pulses. Skin: warm and dry Neuro:  Grossly normal  EKG:  Not done. Ecg on 10/02/15 shows NSR with 1st degree AV block. RBBB. I have  personally reviewed and interpreted this study.  Labs reviewed from 12/11/15: Hgb 13.7. Otherwise CBC normal. Glucose 178. Otherwise CMET is normal. A1c 7.3%. Cholesterol 213, trig-220, HDL 49.5, LDL 120. TSH normal.  ASSESSMENT AND PLAN:  Problem List Items Addressed This Visit    HYPERCHOLESTEROLEMIA   Relevant Medications   rosuvastatin (CRESTOR) 5 MG tablet   OBESITY, MORBID   Coronary atherosclerosis   Relevant Medications   rosuvastatin (CRESTOR) 5 MG tablet   Atrial flutter (HCC) - Primary   Relevant Medications   rosuvastatin (CRESTOR) 5 MG tablet     1. Atrial flutter- s/p ablation without recurrence.  2. CAD with remote stent of the diagonal. He is asymptomatic. Continue ASA,   3. Hypercholesterolemia. Not at goal of LDL 70. Recommend trial of low dose statin with Crestor 5 mg three days a week. He is scheduled for follow up labs with primary care. If still not at goal I would add Zetia 10 mg daily. Stressed importance of dietary modifications.   4. Morbid obesity with OSA. On CPAP. Encourage weight loss.   5. Diabetes mellitus: Followed by primary   follow-up in 6 months

## 2016-01-26 ENCOUNTER — Other Ambulatory Visit: Payer: Self-pay

## 2016-01-26 MED ORDER — ROSUVASTATIN CALCIUM 5 MG PO TABS: ORAL_TABLET | ORAL | Status: DC

## 2016-03-15 ENCOUNTER — Ambulatory Visit: Payer: No Typology Code available for payment source | Admitting: Pulmonary Disease

## 2016-05-03 ENCOUNTER — Ambulatory Visit: Payer: No Typology Code available for payment source | Admitting: Pulmonary Disease

## 2016-06-03 ENCOUNTER — Encounter: Payer: Self-pay | Admitting: Pulmonary Disease

## 2016-06-03 ENCOUNTER — Ambulatory Visit (INDEPENDENT_AMBULATORY_CARE_PROVIDER_SITE_OTHER): Payer: Medicare Other | Admitting: Pulmonary Disease

## 2016-06-03 DIAGNOSIS — I2510 Atherosclerotic heart disease of native coronary artery without angina pectoris: Secondary | ICD-10-CM | POA: Diagnosis not present

## 2016-06-03 DIAGNOSIS — G4733 Obstructive sleep apnea (adult) (pediatric): Secondary | ICD-10-CM | POA: Diagnosis not present

## 2016-06-03 NOTE — Assessment & Plan Note (Signed)
100 PY, quit in 2001.  PFT (12/2015) mild COPD with significant reversibility.  Not significantly better on Spiriva. He stopped Spiriva.  CXR (12/2015)  COPD changes.   Plan : 1. Hold off on spiriva. Alb prn.  2. Pt to call if more symptomatic > will need to restart spiriva.  3. Pt does NOT want to get vaccines.

## 2016-06-03 NOTE — Assessment & Plan Note (Signed)
Weight reduction 

## 2016-06-03 NOTE — Patient Instructions (Signed)
  It was a pleasure taking care of you today!  Continue using your CPAP machine.   Please make sure you use your CPAP device everytime you sleep.  We will monitor the usage of your machine per your insurance requirement.  Your insurance company may take the machine from you if you are not using it regularly.   Please clean the mask, tubings, filter, water reservoir with soapy water every week.  Please use distilled water for the water reservoir.   Please call the office or your machine provider (DME company) if you are having issues with the device.   Return to clinic in 6 months.

## 2016-06-03 NOTE — Assessment & Plan Note (Signed)
Auto 5-15cm 06/2013:  Optimal pressure right at 15cm, adequate control of osa, but significant mask leak.  New machine 10/2014:  Download with great control of AHI and no mask leaks.  Uses cpap. More energy. Less sleepiness. Feels benefit of cpap. DL the last month  100%, AHI 1.2 (12/2015)  Plan : We extensively discussed the importance of treating OSA and the need to use PAP therapy.   Continue with cpap.  I think he is on cpap 15 cm water. Feels better using it. More energy. Less sleepiness. Plan to get a 1 month DL.    Patient was instructed to have mask, tubings, filter, reservoir cleaned at least once a week with soapy water.   Patient was instructed to call the office if he/she is having issues with the PAP device.    I advised patient to obtain sufficient amount of sleep --  7 to 8 hours at least in a 24 hr period.  Patient was advised to follow good sleep hygiene.  Patient was advised NOT to engage in activities requiring concentration and/or vigilance if he/she is and  sleepy.  Patient is NOT to drive if he/she is sleepy.

## 2016-06-03 NOTE — Progress Notes (Signed)
Subjective:    Patient ID: Jackson Parks, male    DOB: 1941/08/10, 75 y.o.   MRN: [id number]  HPI  Pt returns to office as f/u on his OSA.  ROV (12/14/15) pt is here for f/u on his osa. Uses cpap. No issues. 100 PY, quit in 2001. SOB with more than ADLs. Worse the last yr. Significant smoking history. Also gained weight. Sees heart MD as well.   ROV 06/03/16 Patient is here for follow-up on his sleep apnea and COPD. Since last seen, he had PFTs which showed mild COPD with significant reversibility. He was using stiolto  Ut no significant effect so he stopped using it.  He uses his CPAP machine. Feels better using it. More energy. Less sleepiness. No issues with cpap. Has not been admitted nor has been on abx since last seen.   Review of Systems  Constitutional: Negative.   HENT: Negative.   Eyes: Negative.   Respiratory: Positive for cough and shortness of breath.   Cardiovascular: Negative.   Gastrointestinal: Negative.   Endocrine: Negative.   Genitourinary: Negative.   Musculoskeletal: Negative.   Skin: Negative.   Allergic/Immunologic: Negative.   Neurological: Negative.   Hematological: Negative.   Psychiatric/Behavioral: Negative.   All other systems reviewed and are negative.       Objective:   Physical Exam  Vitals:  Vitals:   06/03/16 1630  BP: 108/66  Pulse: 67  SpO2: 96%  Weight: 296 lb 12.8 oz (134.6 kg)  Height: 5\' 10"  (1.778 m)    Constitutional/General:  Pleasant, well-nourished, well-developed, not in any distress,  Comfortably seating.  Well kempt  Body mass index is 42.59 kg/m. Wt Readings from Last 3 Encounters:  06/03/16 296 lb 12.8 oz (134.6 kg)  01/23/16 294 lb (133.4 kg)  12/14/15 296 lb (134.3 kg)     HEENT: Pupils equal and reactive to light and accommodation. Anicteric sclerae. Normal nasal mucosa.   No oral  lesions,  mouth clear,  oropharynx clear, no postnasal drip. (-) Oral thrush. No dental caries.  Airway - Mallampati class  IV  Neck: No masses. Midline trachea. No JVD, (-) LAD. (-) bruits appreciated.  Respiratory/Chest: Grossly normal chest. (-) deformity. (-) Accessory muscle use.  Symmetric expansion. (-) Tenderness on palpation.  Resonant on percussion.  Diminished BS on both lower lung zones. (-) wheezing, crackles, rhonchi (-) egophony  Cardiovascular: Regular rate and  rhythm, heart sounds normal, no murmur or gallops, Gr 1  edema  Gastrointestinal:  Normal bowel sounds. Soft, non-tender. No hepatosplenomegaly.  (-) masses.   Musculoskeletal:  Normal muscle tone. Normal gait.   Extremities: Grossly normal. (-) clubbing, cyanosis.  (+) Gr 1 edema  Skin: (-) rash,lesions seen.   Neurological/Psychiatric : alert, oriented to time, place, person. Normal mood and affect            Assessment & Plan:  OSA (obstructive sleep apnea) Auto 5-15cm 06/2013:  Optimal pressure right at 15cm, adequate control of osa, but significant mask leak.  New machine 10/2014:  Download with great control of AHI and no mask leaks.  Uses cpap. More energy. Less sleepiness. Feels benefit of cpap. DL the last month  100%, AHI 1.2 (12/2015)  Plan : We extensively discussed the importance of treating OSA and the need to use PAP therapy.   Continue with cpap.  I think he is on cpap 15 cm water. Feels better using it. More energy. Less sleepiness. Plan to get a 1 month DL.  Patient was instructed to have mask, tubings, filter, reservoir cleaned at least once a week with soapy water.   Patient was instructed to call the office if he/she is having issues with the PAP device.    I advised patient to obtain sufficient amount of sleep --  7 to 8 hours at least in a 24 hr period.  Patient was advised to follow good sleep hygiene.  Patient was advised NOT to engage in activities requiring concentration and/or vigilance if he/she is and  sleepy.  Patient is NOT to drive if he/she is sleepy.    COPD 100 PY, quit in  2001.  PFT (12/2015) mild COPD with significant reversibility.  Not significantly better on Spiriva. He stopped Spiriva.  CXR (12/2015)  COPD changes.   Plan : 1. Hold off on spiriva. Alb prn.  2. Pt to call if more symptomatic > will need to restart spiriva.  3. Pt does NOT want to get vaccines.   OBESITY, MORBID Weight reduction.   Return to clinic in 6 months.    Monica Becton, MD 06/03/2016, 5:01 PM [facility] Pager [phone] After 3 pm or if no answer, call [phone]

## 2016-06-07 ENCOUNTER — Ambulatory Visit (INDEPENDENT_AMBULATORY_CARE_PROVIDER_SITE_OTHER): Payer: Medicare Other | Admitting: Podiatry

## 2016-06-07 ENCOUNTER — Ambulatory Visit (INDEPENDENT_AMBULATORY_CARE_PROVIDER_SITE_OTHER): Payer: Medicare Other

## 2016-06-07 ENCOUNTER — Encounter: Payer: Self-pay | Admitting: Podiatry

## 2016-06-07 DIAGNOSIS — M659 Synovitis and tenosynovitis, unspecified: Secondary | ICD-10-CM | POA: Diagnosis not present

## 2016-06-07 DIAGNOSIS — M25571 Pain in right ankle and joints of right foot: Secondary | ICD-10-CM

## 2016-06-07 DIAGNOSIS — M7751 Other enthesopathy of right foot: Secondary | ICD-10-CM

## 2016-06-07 DIAGNOSIS — I2510 Atherosclerotic heart disease of native coronary artery without angina pectoris: Secondary | ICD-10-CM | POA: Diagnosis not present

## 2016-06-16 MED ORDER — BETAMETHASONE SOD PHOS & ACET 6 (3-3) MG/ML IJ SUSP: 3.0000 mg | Freq: Once | INTRAMUSCULAR | Status: AC

## 2016-06-16 NOTE — Progress Notes (Signed)
Subjective:  Patient presents today for pain and tenderness to the right ankle. Patient relates significant pain and tenderness when walking.  Patient presents for further treatment and evaluation.    Objective / Physical Exam:  General:  The patient is alert and oriented x3 in no acute distress. Dermatology:  Skin is warm, dry and supple bilateral lower extremities. Negative for open lesions or macerations. Vascular:  Palpable pedal pulses bilaterally. No edema or erythema noted. Capillary refill within normal limits. Neurological:  Epicritic and protective threshold grossly intact bilaterally.  Musculoskeletal Exam:  Pain on palpation to the anterior lateral medial aspects of the patient's left ankle. Mild edema noted.  Range of motion within normal limits to all pedal and ankle joints bilateral. Muscle strength 5/5 in all groups bilateral.   Radiographic Exam:  Normal osseous mineralization. Joint spaces preserved. No fracture/dislocation/boney destruction.    Assessment: #1 pain in right ankle #2 synovitis of right ankle #3 capsulitis of right ankle  Plan of Care:  #1 Patient was evaluated. #2 injection of 0.5 mL Celestone Soluspan injected in the patient's right ankle. #3 prescription for anti-inflammatory pain cream dispensed through [facility]  #4 ankle brace dispensed #5 patient is to return to clinic in 4 weeks   Dr. Edrick Kins, [place]

## 2016-07-09 ENCOUNTER — Ambulatory Visit: Payer: Medicare Other | Admitting: Podiatry

## 2016-07-16 ENCOUNTER — Ambulatory Visit (INDEPENDENT_AMBULATORY_CARE_PROVIDER_SITE_OTHER): Payer: Medicare Other | Admitting: Podiatry

## 2016-07-16 ENCOUNTER — Encounter: Payer: Self-pay | Admitting: Podiatry

## 2016-07-16 DIAGNOSIS — M25571 Pain in right ankle and joints of right foot: Secondary | ICD-10-CM

## 2016-07-16 DIAGNOSIS — M659 Synovitis and tenosynovitis, unspecified: Secondary | ICD-10-CM | POA: Diagnosis not present

## 2016-07-16 DIAGNOSIS — I2510 Atherosclerotic heart disease of native coronary artery without angina pectoris: Secondary | ICD-10-CM | POA: Diagnosis not present

## 2016-07-16 MED ORDER — NONFORMULARY OR COMPOUNDED ITEM: 1.0000 g | Freq: Four times a day (QID) | 2 refills | Status: AC

## 2016-07-16 NOTE — Progress Notes (Signed)
Subjective:  Patient presents today for follow-up evaluation of synovitis and pain to the right ankle. Patient states that he is healed. Patient no longer experiences pain. Patient presents for further treatment and evaluation.    Objective / Physical Exam:  General:  The patient is alert and oriented x3 in no acute distress. Dermatology:  Skin is warm, dry and supple bilateral lower extremities. Negative for open lesions or macerations. Vascular:  Palpable pedal pulses bilaterally. No edema or erythema noted. Capillary refill within normal limits. Neurological:  Epicritic and protective threshold grossly intact bilaterally.  Musculoskeletal Exam:  Negative for pain on palpation to the anterior lateral medial aspects of the patient's left ankle. Mild edema noted.  Range of motion within normal limits to all pedal and ankle joints bilateral. Muscle strength 5/5 in all groups bilateral.    Assessment: #1 pain in right ankle-resolved #2 synovitis of right ankle-resolved #3 capsulitis of right ankle-resolved  Plan of Care:  #1 Patient was evaluated. #2 reorder anti-inflammatory pain cream through [employer]. Patient states he did not receive it. #3 today we discussed conservative modalities to prevent recurrence of ankle pain. Conservative modalities include ankle brace, appropriate shoe gear, and exercise. #4 return to clinic when necessary  Dr. Edrick Kins, [place]

## 2016-08-10 NOTE — Progress Notes (Signed)
Patient ID: Jackson Parks, male   DOB: 04/12/1941, 75 y.o.   MRN: [id number]    Date:  08/14/2016   ID:  Jackson Parks, DOB 1941/04/14, MRN [id number]  PCP:  Marcello Fennel, MD  Primary Cardiologist:  [place]  Chief Complaint  Patient presents with  . Follow-up    no chest pain, no short shortness of breath, no edema, no pain or cramping in legs, no lightheaded or dizziness  . Atrial Fibrillation  . Coronary Artery Disease     History of Present Illness: Jackson Parks is a 75 y.o. male with a history of atrial flutter status post DC cardioversion. He subsequently had an ablation procedure by Dr. [name] in April 2014. He has a history of coronary disease and is status post stenting of the diagonal branch in 2006. Cardiac catheterization in February 2014 showed continued patency of the stent and nonobstructive disease. He also had normal right heart pressures at that time. He does have obstructive sleep apnea and is on CPAP therapy.   On follow up today he reports doing well and has no particular complaints. He denies any chest pain, palpitations, or SOB. He is very active and does a lot of walking. He was just splitting wood yesterday. His blood sugars are up and down. Weight is unfortunately stable.  He reports intolerance to statins- zocor 40 mg and pravastatin 40 mg- due to myalgias. He has been on low dose Crestor 3 days/week and is tolerating this well.    Wt Readings from Last 3 Encounters:  08/14/16 293 lb (132.9 kg)  06/03/16 296 lb 12.8 oz (134.6 kg)  01/23/16 294 lb (133.4 kg)     Past Medical History:  Diagnosis Date  . Atrial flutter ([place]) 10/05/2012  . CAD (coronary artery disease)   . COPD (chronic obstructive pulmonary disease) ([place])   . Diabetes mellitus without complication ([place])   . DM (dermatomyositis)   . Hypercholesterolemia   . Obesity     Current Outpatient Prescriptions  Medication Sig Dispense Refill  . acetaminophen (TYLENOL) 500 MG tablet Take  500 mg by mouth every 6 (six) hours as needed.    Marland Kitchen aspirin EC 81 MG tablet Take 81 mg by mouth daily.    Marland Kitchen glimepiride (AMARYL) 2 MG tablet Take 2 mg by mouth daily before breakfast.    . lisinopril (PRINIVIL,ZESTRIL) 10 MG tablet TAKE 1 TABLET BY MOUTH EVERY EVENING. 30 tablet 0  . NONFORMULARY OR COMPOUNDED ITEM Apply 1-2 g topically 4 (four) times daily. 120 each 2  . rosuvastatin (CRESTOR) 5 MG tablet Take 5 mg 5 times a week  Mon-Tues- Wed-Thurs-Fri 90 tablet 3  . sitaGLIPtan-metformin (JANUMET) 50-1000 MG per tablet Take 1 tablet by mouth 2 (two) times daily with a meal.    . traZODone (DESYREL) 100 MG tablet Take 1 tablet by mouth at bedtime.     Current Facility-Administered Medications  Medication Dose Route Frequency Provider Last Rate Last Dose  . betamethasone acetate-betamethasone sodium phosphate (CELESTONE) injection 3 mg  3 mg Intramuscular Once Edrick Kins, DPM        Allergies:    Allergies  Allergen Reactions  . Nsaids Other (See Comments)    Sever GI upset   . Statins     Muscle pain    Social History:  The patient  reports that he quit smoking about 15 years ago. His smoking use included Cigarettes. He has a 117.00 pack-year smoking history. He does not have any smokeless  tobacco history on file. He reports that he drinks alcohol. He reports that he does not use drugs.   Family history:   Family History  Problem Relation Age of Onset  . Heart failure Mother 58    deceased  . Heart attack Father 14    deceased  . Coronary artery disease Brother 6  . Peripheral vascular disease Brother 68    ROS:  Please see the history of present illness.  All other systems reviewed and negative.   PHYSICAL EXAM: VS:  BP 134/68 (BP Location: Left Arm, Patient Position: Sitting, Cuff Size: Large)   Pulse 64   Ht 5\' 11"  (1.803 m)   Wt 293 lb (132.9 kg)   BMI 40.87 kg/m  Obese, well developed, obese WM in no acute distress  HEENT: Pupils are equal round react to  light accommodation extraocular movements are intact.  Neck: no JVD No cervical lymphadenopathy. Cardiac: Regular rate and rhythm 1/6 systolic murmur at the RUSB.  Lungs:  clear to auscultation bilaterally, no wheezing, rhonchi or rales  Abd: Soft nontender, positive bowel sounds all quadrants,   Ext: no lower extremity edema.  2+ radial and dorsalis pedis pulses. Skin: warm and dry  Neuro:  Grossly normal  EKG:  Not done.   Labs reviewed from 06/06/16: Hgb 13.7. Otherwise CBC normal. Glucose 149. Otherwise CMET is normal. A1c 7.5%. Cholesterol 184, trig-155, HDL 50, LDL 103. TSH normal.  ASSESSMENT AND PLAN:  Problem List Items Addressed This Visit    None     1. Atrial flutter- s/p ablation without recurrence.  2. CAD with remote stent of the diagonal. He is asymptomatic. Continue ASA,   3. Hypercholesterolemia. Not at goal of LDL 70. LDL reduced from 120>>103 with low dose Crestor. We reviewed options at this point. We can increase Crestor, add Zetia, consider PCSK9 inhibitor or consider him for one of our clinical trialls of lipid lowering therapy. He would like to increase Cresotr first. Will take 5x?wk. Stressed importance of dietary modifications.   4. Morbid obesity with OSA. On CPAP. Encourage weight loss.   5. Diabetes mellitus: Followed by primary   follow-up in 6 months

## 2016-08-14 ENCOUNTER — Encounter: Payer: Self-pay | Admitting: Cardiology

## 2016-08-14 ENCOUNTER — Ambulatory Visit (INDEPENDENT_AMBULATORY_CARE_PROVIDER_SITE_OTHER): Payer: Medicare Other | Admitting: Cardiology

## 2016-08-14 VITALS — BP 134/68 | HR 64 | Ht 71.0 in | Wt 293.0 lb

## 2016-08-14 DIAGNOSIS — I2510 Atherosclerotic heart disease of native coronary artery without angina pectoris: Secondary | ICD-10-CM

## 2016-08-14 DIAGNOSIS — I483 Typical atrial flutter: Secondary | ICD-10-CM

## 2016-08-14 DIAGNOSIS — E7800 Pure hypercholesterolemia, unspecified: Secondary | ICD-10-CM | POA: Diagnosis not present

## 2016-08-14 MED ORDER — ROSUVASTATIN CALCIUM 5 MG PO TABS: ORAL_TABLET | ORAL | 3 refills | Status: DC

## 2016-08-14 NOTE — Patient Instructions (Signed)
Increase crestor to 5 mg 5 days a week.  I will see you in 6 months

## 2016-11-20 DIAGNOSIS — G5600 Carpal tunnel syndrome, unspecified upper limb: Secondary | ICD-10-CM | POA: Insufficient documentation

## 2016-12-06 ENCOUNTER — Telehealth: Payer: Self-pay | Admitting: Cardiology

## 2016-12-06 NOTE — Telephone Encounter (Signed)
Faxed via epic to Dr Smith Mince [phone]

## 2016-12-06 NOTE — Telephone Encounter (Signed)
He is cleared for carpel tunnel release from my standpoint.  Aleicia Kenagy [place] MD, [facility]

## 2016-12-06 NOTE — Telephone Encounter (Signed)
Wife notified(DPR)

## 2016-12-06 NOTE — Telephone Encounter (Signed)
Spoke to Dr.Liebelt's office advised Dr.Jordan out of office today.I will fax surgical clearance on Mon 12/09/16.

## 2016-12-06 NOTE — Telephone Encounter (Signed)
New Message   Needs this by Monday !  Last office visit notes, last ablations, how he did after ablation , last ekg, last stress test  And surgical clearance    Request for surgical clearance:  1. What type of surgery is being performed? Rt carpal tunnel release   2. When is this surgery scheduled? 5/7  3. Are there any medications that need to be held prior to surgery and how long? n/a  4. Name of physician performing surgery? Dr Smith Mince  5. What is your office phone and fax number? [phone] fax  ofc [phone]

## 2016-12-09 ENCOUNTER — Telehealth: Payer: Self-pay | Admitting: Cardiology

## 2016-12-09 NOTE — Telephone Encounter (Signed)
He is cleared for carpel tunnel release from my standpoint.  [name] MD, [facility]

## 2016-12-09 NOTE — Telephone Encounter (Signed)
Received surgical clearance from [place].Dr.Jordan cleared patient for upcoming right carpel tunnel surgery.Clearance faxed back to fax # [phone].

## 2016-12-09 NOTE — Telephone Encounter (Signed)
Faxed surgical clearance to Dr.Ralph Liebelt at fax # [phone].

## 2016-12-09 NOTE — Telephone Encounter (Signed)
Please fax,clearance,ekg,office notes and lab to [phone].

## 2016-12-09 NOTE — Telephone Encounter (Signed)
s/w triage ar Dr Gerrit Heck, they state that they do not see that anyone called and will inform scheduling that clearance was sent Friday 12-06-16 and have them call back if anything else is needed

## 2016-12-09 NOTE — Telephone Encounter (Signed)
New message    [phone] fax   Please include stress test and Ekg with the surgical clearance , needs it today before 945a

## 2017-02-27 NOTE — Progress Notes (Signed)
Patient ID: Jackson Parks, male   DOB: [birth year], 76 y.o.   MRN: [id number]    Date:  03/06/2017   ID:  Jackson Parks, DOB 1941-02-03, MRN [id number]  PCP:  [name], MD  Primary Cardiologist:  [place]  Chief Complaint  Patient presents with  . Follow-up    6 months     History of Present Illness: Jackson Parks is a 76 y.o. male with a history of atrial flutter status post DC cardioversion. He subsequently had an ablation procedure by Dr. [name] in April 2014. He has a history of coronary disease and is status post stenting of the diagonal branch in 2006. Cardiac catheterization in February 2014 showed continued patency of the stent and nonobstructive disease. He also had normal right heart pressures at that time. He does have obstructive sleep apnea and is on CPAP therapy.   On follow up today he reports doing well.He denies any chest pain, palpitations, or SOB. He is active around the house.  Weight is unfortunately up 4 lbs.  Eating a lot of tomato sandwichs. . He has been on low dose Crestor 3 days/week. Tried taking 5 days a week but noted more myalgias. Takes it sometimes 5x/wk but mostly 3x/wk. Notes aching in his legs. Had vascular studies one year ago that were normal. Prior CT of Abdomen/pelvis showed significant lumbar disc disease.     Wt Readings from Last 3 Encounters:  03/06/17 297 lb 9.6 oz (135 kg)  08/14/16 293 lb (132.9 kg)  06/03/16 296 lb 12.8 oz (134.6 kg)     Past Medical History:  Diagnosis Date  . Atrial flutter ([place]) 10/05/2012  . CAD (coronary artery disease)   . COPD (chronic obstructive pulmonary disease) ([place])   . Diabetes mellitus without complication ([place])   . DM (dermatomyositis)   . Hypercholesterolemia   . Obesity     Current Outpatient Prescriptions  Medication Sig Dispense Refill  . acetaminophen (TYLENOL) 500 MG tablet Take 500 mg by mouth every 6 (six) hours as needed.    Marland Kitchen aspirin EC 81 MG tablet Take 81 mg by mouth daily.     Marland Kitchen glimepiride (AMARYL) 2 MG tablet Take 2 mg by mouth daily before breakfast.    . lisinopril (PRINIVIL,ZESTRIL) 10 MG tablet TAKE 1 TABLET BY MOUTH EVERY EVENING. 30 tablet 0  . NONFORMULARY OR COMPOUNDED ITEM Apply 1-2 g topically 4 (four) times daily. 120 each 2  . rosuvastatin (CRESTOR) 5 MG tablet Take 5 mg 5 times a week  Mon-Tues- Wed-Thurs-Fri 90 tablet 3  . sitaGLIPtan-metformin (JANUMET) 50-1000 MG per tablet Take 1 tablet by mouth 2 (two) times daily with a meal.    . traZODone (DESYREL) 100 MG tablet Take 1 tablet by mouth at bedtime.     Current Facility-Administered Medications  Medication Dose Route Frequency Provider Last Rate Last Dose  . betamethasone acetate-betamethasone sodium phosphate (CELESTONE) injection 3 mg  3 mg Intramuscular Once Edrick Kins, DPM        Allergies:    Allergies  Allergen Reactions  . Nsaids Other (See Comments)    Sever GI upset   . Statins     Muscle pain    Social History:  The patient  reports that he quit smoking about 16 years ago. His smoking use included Cigarettes. He has a 117.00 pack-year smoking history. He has never used smokeless tobacco. He reports that he drinks alcohol. He reports that he does not use drugs.   Family history:  Family History  Problem Relation Age of Onset  . Heart failure Mother 73       deceased  . Heart attack Father 18       deceased  . Coronary artery disease Brother 70  . Peripheral vascular disease Brother 68    ROS:  Please see the history of present illness.  All other systems reviewed and negative.   PHYSICAL EXAM: VS:  BP 129/74   Pulse 66   Ht 5\' 11"  (1.803 m)   Wt 297 lb 9.6 oz (135 kg)   BMI 41.51 kg/m  Obese, well developed, obese WM in no acute distress  HEENT: Pupils are equal round react to light accommodation extraocular movements are intact.  Neck: no JVD No cervical lymphadenopathy. Cardiac: Regular rate and rhythm 1/6 systolic murmur at the RUSB.  Lungs:  clear to  auscultation bilaterally, no wheezing, rhonchi or rales  Abd: Soft nontender, positive bowel sounds all quadrants,   Ext: no lower extremity edema.  2+ radial and dorsalis pedis pulses. Skin: warm and dry  Neuro:  Grossly normal  EKG:  Today- NSR with RBBB. I have personally reviewed and interpreted this study.    Labs reviewed from 06/06/16: Hgb 13.7. Otherwise CBC normal. Glucose 149. Otherwise CMET is normal. A1c 7.5%. Cholesterol 184, trig-155, HDL 50, LDL 103. TSH normal. Dated 12/12/16: cholesterol 188, triglycerides 306, HDL 52, LDL 75. Hgb 14. Glucose 169. A1c 7.8. Otherwise CMET normal.  ASSESSMENT AND PLAN:  Problem List Items Addressed This Visit    HYPERCHOLESTEROLEMIA   Coronary atherosclerosis - Primary   Atrial flutter ([place])    Other Visit Diagnoses    Morbid obesity due to excess calories ([place])         1. Atrial flutter- s/p ablation without recurrence.  2. CAD with remote stent of the diagonal. He is asymptomatic. Continue ASA,   3. Hypercholesterolemia. Improved from prior. LDL now 75. Triglycerides elevated reflecting glycemic control. Recommend he increase efforts at walking daily and weight loss.   4. Morbid obesity with OSA. On CPAP. Encourage weight loss.   5. Diabetes mellitus: Followed by primary   follow-up in 6 months

## 2017-03-05 ENCOUNTER — Encounter: Payer: Self-pay | Admitting: Internal Medicine

## 2017-03-06 ENCOUNTER — Encounter: Payer: Self-pay | Admitting: Cardiology

## 2017-03-06 ENCOUNTER — Ambulatory Visit (INDEPENDENT_AMBULATORY_CARE_PROVIDER_SITE_OTHER): Payer: Medicare Other | Admitting: Cardiology

## 2017-03-06 ENCOUNTER — Ambulatory Visit (INDEPENDENT_AMBULATORY_CARE_PROVIDER_SITE_OTHER): Payer: Medicare Other | Admitting: Internal Medicine

## 2017-03-06 ENCOUNTER — Encounter: Payer: Self-pay | Admitting: Internal Medicine

## 2017-03-06 VITALS — BP 129/74 | HR 66 | Ht 71.0 in | Wt 297.6 lb

## 2017-03-06 VITALS — BP 122/80 | HR 71 | Ht 71.0 in | Wt 298.4 lb

## 2017-03-06 DIAGNOSIS — I483 Typical atrial flutter: Secondary | ICD-10-CM

## 2017-03-06 DIAGNOSIS — I2510 Atherosclerotic heart disease of native coronary artery without angina pectoris: Secondary | ICD-10-CM | POA: Diagnosis not present

## 2017-03-06 DIAGNOSIS — E7800 Pure hypercholesterolemia, unspecified: Secondary | ICD-10-CM

## 2017-03-06 DIAGNOSIS — G4733 Obstructive sleep apnea (adult) (pediatric): Secondary | ICD-10-CM | POA: Diagnosis not present

## 2017-03-06 DIAGNOSIS — J449 Chronic obstructive pulmonary disease, unspecified: Secondary | ICD-10-CM

## 2017-03-06 NOTE — Patient Instructions (Addendum)
We can continue CPAP 15/ [employer], mask of choice, humidifier, supplies, AirVew    Dx OSA  I agree with your cardiologist- It would be good to walk more and you will breathe better if you can build your stamina and lose weight that way.   Please call if we can help

## 2017-03-06 NOTE — Patient Instructions (Signed)
Walk every day for 30 minutes  Focus on weigh loss. Restrict those Carbs!  I will see you in 6 months.

## 2017-03-06 NOTE — Progress Notes (Signed)
03/06/17-76 year old male former smoker followed here for OSA/CPAP and for mild COPD, complicated by DM 2, CAD/stent, a flutter/ablation Former [facility] patient. DME: [employer]. Pt wears CPAP nightly and pressure works well for patient. DL attached.  CPAP 15/[employer] PFT-12/21/15-minimal obstructive airways disease-FVC 3.82/95%, FEV1 3.23/110%, ratio 0.85, TLC 88%, DLCO 77% NPSG-2003 CXR 12/14/15- IMPRESSION: 1. Stable mild cardiomegaly. 2. Low lung volumes with mild bibasilar atelectasis. Mild infiltrate in the right lung base cannot be completely excluded . Small bilateral pleural effusions cannot be excluded. He says he can't sleep without his CPAP Current CPAP machine is 74 years old. Fullface mask. Download 94% compliance, AHI 1.1/hour. He was a 3 pack per day smoker until he quit 16 years ago. Denies routine cough or wheeze.  ROS-see HPI   + = pos Constitutional:    weight loss, night sweats, fevers, chills, fatigue, lassitude. HEENT:    headaches, difficulty swallowing, tooth/dental problems, sore throat,       sneezing, itching, ear ache, nasal congestion, post nasal drip, snoring CV:    chest pain, orthopnea, PND, swelling in lower extremities, anasarca,                                                  dizziness, palpitations Resp:   shortness of breath with exertion or at rest.                productive cough,   non-productive cough, coughing up of blood.              change in color of mucus.  wheezing.   Skin:    rash or lesions. GI:  No-   heartburn, indigestion, abdominal pain, nausea, vomiting, diarrhea,                 change in bowel habits, loss of appetite GU: dysuria, change in color of urine, no urgency or frequency.   flank pain. MS:   joint pain, stiffness, decreased range of motion, back pain. Neuro-     nothing unusual Psych:  change in mood or affect.  depression or anxiety.   memory loss.  OBJ- Physical Exam General- Alert, Oriented, Affect-appropriate, Distress- none  acute, + obese Skin- rash-none, lesions- none, excoriation- none Lymphadenopathy- none Head- atraumatic            Eyes- Gross vision intact, PERRLA, conjunctivae and secretions clear            Ears- Hearing, canals-normal            Nose- Clear, no-Septal dev, mucus, polyps, erosion, perforation             Throat- Mallampati IV, mucosa clear , drainage- none, tonsils- atrophic, has lower teeth, otherwise edentulous Neck- flexible , trachea midline, no stridor , thyroid nl, carotid no bruit Chest - symmetrical excursion , unlabored           Heart/CV- RRR , no murmur , no gallop  , no rub, nl s1 s2                           - JVD- none , edema- none, stasis changes- none, varices- none           Lung- clear to P&A, wheeze- none, cough- none , dullness-none, rub- none  Chest wall-  Abd-  Br/ Gen/ Rectal- Not done, not indicated Extrem- cyanosis- none, clubbing, none, atrophy- none, strength- nl Neuro- grossly intact to observation

## 2017-03-20 ENCOUNTER — Other Ambulatory Visit: Payer: Self-pay | Admitting: *Deleted

## 2017-03-20 MED ORDER — ROSUVASTATIN CALCIUM 5 MG PO TABS: ORAL_TABLET | ORAL | 2 refills | Status: DC

## 2017-03-23 NOTE — Assessment & Plan Note (Signed)
He understands the importance of weight loss that has not been prepared to make a lifestyle change to accomplish this. Support is offered. Consider bariatric referral.

## 2017-03-23 NOTE — Assessment & Plan Note (Signed)
Good compliance and control. He clearly benefits from CPAP. We can continue current pressure 15

## 2017-03-23 NOTE — Assessment & Plan Note (Signed)
He has minimal obstructive airways disease, fortunate since he had been a 3 pack per day smoker until quitting 16 years ago. He is limited more by obesity and deconditioning.

## 2017-08-13 DIAGNOSIS — I2510 Atherosclerotic heart disease of native coronary artery without angina pectoris: Secondary | ICD-10-CM | POA: Insufficient documentation

## 2017-08-13 DIAGNOSIS — J449 Chronic obstructive pulmonary disease, unspecified: Secondary | ICD-10-CM | POA: Insufficient documentation

## 2017-08-13 DIAGNOSIS — E119 Type 2 diabetes mellitus without complications: Secondary | ICD-10-CM | POA: Insufficient documentation

## 2017-09-03 NOTE — Progress Notes (Signed)
Patient ID: Jackson Parks, male   DOB: Feb 24, 1941, 77 y.o.   MRN: [id number]    Date:  09/09/2017   ID:  Jackson Parks, DOB 1940/08/18, MRN [id number]  PCP:  [name], MD  Primary Cardiologist:  [place]  Chief Complaint  Patient presents with  . Follow-up  . Coronary Artery Disease  . Atrial Fibrillation     History of Present Illness: Jackson Parks is a 77 y.o. male with a history of atrial flutter status post DC cardioversion. He subsequently had an ablation procedure by Dr. [name] in April 2014. He has a history of coronary disease and is status post stenting of the diagonal branch in 2006. Cardiac catheterization in February 2014 showed continued patency of the stent and nonobstructive disease. He also had normal right heart pressures at that time. He does have obstructive sleep apnea and is on CPAP therapy.   On follow up today he reports doing well. He did start a walking program last summer but backed off because his calves were hurting. Pain is mostly after he exercises. Reports LE arterial dopplers in last year that were normal. Placed on CoQ 10 with some improvement. He denies any chest pain, palpitations, or SOB. . . He has been on low dose Crestor 3 days/week. Tried taking 5 days a week but noted more myalgias. Takes it sometimes 5x/wk but mostly 3x/wk.    Wt Readings from Last 3 Encounters:  09/09/17 291 lb (132 kg)  03/06/17 298 lb 6.4 oz (135.4 kg)  03/06/17 297 lb 9.6 oz (135 kg)     Past Medical History:  Diagnosis Date  . Atrial flutter ([place]) 10/05/2012  . CAD (coronary artery disease)   . COPD (chronic obstructive pulmonary disease) ([place])   . Diabetes mellitus without complication ([place])   . DM (dermatomyositis)   . Hypercholesterolemia   . Obesity     Current Outpatient Medications  Medication Sig Dispense Refill  . acetaminophen (TYLENOL) 500 MG tablet Take 500 mg by mouth every 6 (six) hours as needed.    Marland Kitchen aspirin EC 81 MG tablet Take 81 mg by  mouth daily.    Marland Kitchen Co-Enzyme Q-10 100 MG CAPS Take 50 mg by mouth daily.    Marland Kitchen glimepiride (AMARYL) 2 MG tablet Take 2 mg by mouth daily before breakfast.    . lisinopril (PRINIVIL,ZESTRIL) 10 MG tablet TAKE 1 TABLET BY MOUTH EVERY EVENING. 30 tablet 0  . NONFORMULARY OR COMPOUNDED ITEM Apply 1-2 g topically 4 (four) times daily. 120 each 2  . rosuvastatin (CRESTOR) 5 MG tablet Take 5 mg 5 times a week  Mon-Tues- Wed-Thurs-Fri 60 tablet 2  . sitaGLIPtan-metformin (JANUMET) 50-1000 MG per tablet Take 1 tablet by mouth 2 (two) times daily with a meal.    . traZODone (DESYREL) 100 MG tablet Take 1 tablet by mouth at bedtime.     Current Facility-Administered Medications  Medication Dose Route Frequency Provider Last Rate Last Dose  . betamethasone acetate-betamethasone sodium phosphate (CELESTONE) injection 3 mg  3 mg Intramuscular Once Edrick Kins, DPM        Allergies:    Allergies  Allergen Reactions  . Nsaids Other (See Comments)    Sever GI upset   . Statins     Muscle pain    Social History:  The patient  reports that he quit smoking about 16 years ago. His smoking use included cigarettes. He has a 117.00 pack-year smoking history. he has never used smokeless tobacco. He reports  that he drinks alcohol. He reports that he does not use drugs.   Family history:   Family History  Problem Relation Age of Onset  . Heart failure Mother 5       deceased  . Heart attack Father 87       deceased  . Coronary artery disease Brother 33  . Peripheral vascular disease Brother 68    ROS:  Please see the history of present illness.  All other systems reviewed and negative.   PHYSICAL EXAM: VS:  BP 120/64   Pulse 66   Ht 5\' 10"  (1.778 m)   Wt 291 lb (132 kg)   BMI 41.75 kg/m  GENERAL:  Well appearing obese WM HEENT:  PERRL, EOMI, sclera are clear. Oropharynx is clear. NECK:  No jugular venous distention, carotid upstroke brisk and symmetric, no bruits, no thyromegaly or  adenopathy LUNGS:  Clear to auscultation bilaterally CHEST:  Unremarkable HEART:  RRR,  PMI not displaced or sustained,S1 and S2 within normal limits, no S3, no S4: no clicks, no rubs, no murmurs ABD:  Soft, nontender. BS +, no masses or bruits. No hepatomegaly, no splenomegaly EXT:  2 + pulses throughout, no edema, no cyanosis no clubbing SKIN:  Warm and dry.  No rashes NEURO:  Alert and oriented x 3. Cranial nerves II through XII intact. PSYCH:  Cognitively intact   Labs reviewed from 06/06/16: Hgb 13.7. Otherwise CBC normal. Glucose 149. Otherwise CMET is normal. A1c 7.5%. Cholesterol 184, trig-155, HDL 50, LDL 103. TSH normal. Dated 12/12/16: cholesterol 188, triglycerides 306, HDL 52, LDL 75. Hgb 14. Glucose 169. A1c 7.8. Otherwise CMET normal. Dated 08/27/17: cholesterol 168, triglycerides 189, HDL 51, LDL 79. A1c 7.7%. Glucose 186. Other chemistries normal. hgb 13.9.   ASSESSMENT AND PLAN:  Problem List Items Addressed This Visit    HYPERCHOLESTEROLEMIA   Coronary atherosclerosis - Primary   Atrial flutter ([place])    Other Visit Diagnoses    Morbid obesity due to excess calories ([place])         1. Atrial flutter- s/p ablation without recurrence.  2. CAD with remote stent of the diagonal. He is asymptomatic. Continue ASA,   3. Hypercholesterolemia. Improved from prior. LDL now 79.  Recommend he increase efforts at walking daily and weight loss. Continue crestor.  4. Morbid obesity with OSA. On CPAP. Encourage weight loss.   5. Diabetes mellitus: Followed by primary. Again needs to focus on dietary modification and weight loss.   follow-up in one year.

## 2017-09-09 ENCOUNTER — Encounter: Payer: Self-pay | Admitting: Internal Medicine

## 2017-09-09 ENCOUNTER — Ambulatory Visit (INDEPENDENT_AMBULATORY_CARE_PROVIDER_SITE_OTHER): Payer: Medicare Other | Admitting: Cardiology

## 2017-09-09 ENCOUNTER — Ambulatory Visit (INDEPENDENT_AMBULATORY_CARE_PROVIDER_SITE_OTHER): Payer: Medicare Other | Admitting: Internal Medicine

## 2017-09-09 ENCOUNTER — Ambulatory Visit (INDEPENDENT_AMBULATORY_CARE_PROVIDER_SITE_OTHER)
Admission: RE | Admit: 2017-09-09 | Discharge: 2017-09-09 | Disposition: A | Discharge status: Home / Self Care | Payer: Medicare Other | Source: Ambulatory Visit | Attending: Internal Medicine | Admitting: Internal Medicine

## 2017-09-09 ENCOUNTER — Other Ambulatory Visit: Payer: Self-pay

## 2017-09-09 ENCOUNTER — Encounter: Payer: Self-pay | Admitting: Cardiology

## 2017-09-09 VITALS — BP 138/72 | HR 70 | Ht 71.0 in | Wt 291.2 lb

## 2017-09-09 VITALS — BP 120/64 | HR 66 | Ht 70.0 in | Wt 291.0 lb

## 2017-09-09 DIAGNOSIS — J449 Chronic obstructive pulmonary disease, unspecified: Secondary | ICD-10-CM | POA: Diagnosis not present

## 2017-09-09 DIAGNOSIS — I2510 Atherosclerotic heart disease of native coronary artery without angina pectoris: Secondary | ICD-10-CM

## 2017-09-09 DIAGNOSIS — I483 Typical atrial flutter: Secondary | ICD-10-CM

## 2017-09-09 DIAGNOSIS — E7800 Pure hypercholesterolemia, unspecified: Secondary | ICD-10-CM

## 2017-09-09 DIAGNOSIS — Z87891 Personal history of nicotine dependence: Secondary | ICD-10-CM

## 2017-09-09 DIAGNOSIS — G4733 Obstructive sleep apnea (adult) (pediatric): Secondary | ICD-10-CM

## 2017-09-09 MED ORDER — LISINOPRIL 10 MG PO TABS: 10.0000 mg | ORAL_TABLET | Freq: Every evening | ORAL | 3 refills | Status: DC

## 2017-09-09 MED ORDER — ROSUVASTATIN CALCIUM 5 MG PO TABS: ORAL_TABLET | ORAL | 3 refills | Status: DC

## 2017-09-09 NOTE — Patient Instructions (Signed)
Continue your current medication  Work on your walking, losing weight and diet  I will see you in one year.

## 2017-09-09 NOTE — Progress Notes (Signed)
HPI male former smoker followed here for OSA/CPAP and for mild COPD, complicated by DM 2, CAD/stent, a flutter/ablation PFT-12/21/15-minimal obstructive airways disease-FVC 3.82/95%, FEV1 3.23/110%, ratio 0.85, TLC 88%, DLCO 77% NPSG-2003-reports not available  -------------------------------------------------------------------------------------  03/06/17-77 year old male former smoker followed here for OSA/CPAP and for mild COPD, complicated by DM 2, CAD/stent, a flutter/ablation Former [facility] patient. DME: [facility]. Pt wears CPAP nightly and pressure works well for patient. DL attached.  CPAP 15/[facility] PFT-12/21/15-minimal obstructive airways disease-FVC 3.82/95%, FEV1 3.23/110%, ratio 0.85, TLC 88%, DLCO 77% NPSG-2003-report not available CXR 12/14/15- IMPRESSION: 1. Stable mild cardiomegaly. 2. Low lung volumes with mild bibasilar atelectasis. Mild infiltrate in the right lung base cannot be completely excluded . Small bilateral pleural effusions cannot be excluded. He says he can't sleep without his CPAP Current CPAP machine is 75 years old. Fullface mask. Download 94% compliance, AHI 1.1/hour. He was a 3 pack per day smoker until he quit 16 years ago. Denies routine cough or wheeze.  09/09/17- 77 year old male former smoker followed here for OSA/CPAP and for mild COPD, complicated by DM 2, CAD/stent, a flutter/ablation CPAP 15/ [facility] ----OSA; DME:[facility]. Pt wears CPAP nightly for at least 9-10 hours. Pressure works well for patient and no new supplies needed at this time.  No DL as patient did not bring SD Card. Pt not able to be put in AV.  Patient declines flu shot saying brother had Mauricio Po Despite long smoking history, chest feels fine he says with no cough or discomfort.  He got a good report from his cardiologist and is scheduled for 1 year follow-up there. Still reports he cannot sleep without his CPAP.  There has been no break in therapy.  He clearly benefits-sleeping  better.  ROS-see HPI   + = pos Constitutional:    weight loss, night sweats, fevers, chills, fatigue, lassitude. HEENT:    headaches, difficulty swallowing, tooth/dental problems, sore throat,       sneezing, itching, ear ache, nasal congestion, post nasal drip, snoring CV:    chest pain, orthopnea, PND, swelling in lower extremities, anasarca,                                                  dizziness, palpitations Resp:   shortness of breath with exertion or at rest.                productive cough,   non-productive cough, coughing up of blood.              change in color of mucus.  wheezing.   Skin:    rash or lesions. GI:  No-   heartburn, indigestion, abdominal pain, nausea, vomiting, diarrhea,                 change in bowel habits, loss of appetite GU: dysuria, change in color of urine, no urgency or frequency.   flank pain. MS:   joint pain, stiffness, decreased range of motion, back pain. Neuro-     nothing unusual Psych:  change in mood or affect.  depression or anxiety.   memory loss.  OBJ- Physical Exam General- Alert, Oriented, Affect-appropriate, Distress- none acute, + obese Skin- rash-none, lesions- none, excoriation- none Lymphadenopathy- none Head- atraumatic            Eyes- Gross vision intact, PERRLA, conjunctivae and secretions clear  Ears- Hearing, canals-normal            Nose- Clear, no-Septal dev, mucus, polyps, erosion, perforation             Throat- Mallampati IV, mucosa clear , drainage- none, tonsils- atrophic, has lower teeth, otherwise edentulous Neck- flexible , trachea midline, no stridor , thyroid nl, carotid no bruit Chest - symmetrical excursion , unlabored           Heart/CV- RRR , no murmur , no gallop  , no rub, nl s1 s2                           - JVD- none , edema- none, stasis changes- none, varices- none           Lung-crackles right base+, wheeze- none, cough- none , dullness-none, rub- none           Chest wall-  Abd-  Br/  Gen/ Rectal- Not done, not indicated Extrem- cyanosis- none, clubbing, none, atrophy- none, strength- nl Neuro- grossly intact to observation

## 2017-09-09 NOTE — Assessment & Plan Note (Signed)
Minimal obstruction and minimal symptoms.  He is not using inhalers. Plan-CXR

## 2017-09-09 NOTE — Patient Instructions (Signed)
Order- CXR    Dx former tobacco user  We can continue CPAP 15/ [facility]  Please call if we can help

## 2017-09-09 NOTE — Assessment & Plan Note (Signed)
PDepends on CPAP and reports no break in therapy, stating he cannot sleep without it. Plan-continue CPAP 15

## 2018-08-27 NOTE — Progress Notes (Signed)
Patient ID: Jackson Parks, male   DOB: [birth year], 78 y.o.   MRN: [id number]    Date:  09/09/2018   ID:  Jackson Parks, DOB [birth year], MRN [id number]  PCP:  [name], MD  Primary Cardiologist:  [place]  Chief Complaint  Patient presents with  . Follow-up    12 months.  . Coronary Artery Disease  . Atrial Flutter     History of Present Illness: Jackson Parks is a 78 y.o. male with a history of atrial flutter status post DC cardioversion. He subsequently had an ablation procedure by Dr. [name] in April 2014. He has a history of coronary disease and is status post stenting of the diagonal branch in 2006. Cardiac catheterization in February 2014 showed continued patency of the stent and nonobstructive disease. He also had normal right heart pressures at that time. He does have obstructive sleep apnea and is on CPAP therapy.   On follow up today he is doing very well. He denies any chest pain, dyspnea, edema, or palpitations. He is active and has been splitting and stacking wood. He has lost 9 lbs since last year. He is intolerant to statins but is taking low dose Crestor 3-5 x/week. It looks like we discussed Zetia 2 years ago but it is unclear if this was ever started.    Wt Readings from Last 3 Encounters:  09/09/18 282 lb (127.9 kg)  09/09/17 291 lb 3.2 oz (132.1 kg)  09/09/17 291 lb (132 kg)     Past Medical History:  Diagnosis Date  . Atrial flutter ([place]) 10/05/2012  . CAD (coronary artery disease)   . COPD (chronic obstructive pulmonary disease) ([place])   . Diabetes mellitus without complication ([place])   . DM (dermatomyositis)   . Hypercholesterolemia   . Obesity     Current Outpatient Medications  Medication Sig Dispense Refill  . acetaminophen (TYLENOL) 500 MG tablet Take 500 mg by mouth every 6 (six) hours as needed.    Marland Kitchen aspirin EC 81 MG tablet Take 81 mg by mouth daily.    Marland Kitchen Co-Enzyme Q-10 100 MG CAPS Take 50 mg by mouth daily.    Marland Kitchen glimepiride (AMARYL) 2  MG tablet Take 2 mg by mouth daily before breakfast.    . lisinopril (PRINIVIL,ZESTRIL) 10 MG tablet Take 1 tablet (10 mg total) by mouth every evening. 90 tablet 3  . NONFORMULARY OR COMPOUNDED ITEM Apply 1-2 g topically 4 (four) times daily. 120 each 2  . rosuvastatin (CRESTOR) 5 MG tablet Take 5 mg 5 times a week  Mon-Tues- Wed-Thurs-Fri 90 tablet 3  . sitaGLIPtan-metformin (JANUMET) 50-1000 MG per tablet Take 1 tablet by mouth 2 (two) times daily with a meal.    . traZODone (DESYREL) 100 MG tablet Take 1 tablet by mouth at bedtime.     Current Facility-Administered Medications  Medication Dose Route Frequency Provider Last Rate Last Dose  . betamethasone acetate-betamethasone sodium phosphate (CELESTONE) injection 3 mg  3 mg Intramuscular Once Edrick Kins, DPM        Allergies:    Allergies  Allergen Reactions  . Nsaids Other (See Comments)    Sever GI upset   . Statins     Muscle pain  . Tolmetin Other (See Comments)    Sever GI upset     Social History:  The patient  reports that he quit smoking about 17 years ago. His smoking use included cigarettes. He has a 117.00 pack-year smoking history. He has never used smokeless  tobacco. He reports current alcohol use. He reports that he does not use drugs.   Family history:   Family History  Problem Relation Age of Onset  . Heart failure Mother 72       deceased  . Heart attack Father 62       deceased  . Coronary artery disease Brother 60  . Peripheral vascular disease Brother 68    ROS:  Please see the history of present illness.  All other systems reviewed and negative.   PHYSICAL EXAM: VS:  BP (!) 110/58 (BP Location: Left Arm, Patient Position: Sitting, Cuff Size: Large)   Pulse 82   Ht 5\' 11"  (1.803 m)   Wt 282 lb (127.9 kg)   BMI 39.33 kg/m  GENERAL:  Well appearing, obese WM in NAD HEENT:  PERRL, EOMI, sclera are clear. Oropharynx is clear. NECK:  No jugular venous distention, carotid upstroke brisk and  symmetric, no bruits, no thyromegaly or adenopathy LUNGS:  Clear to auscultation bilaterally CHEST:  Unremarkable HEART:  RRR,  PMI not displaced or sustained,S1 and S2 within normal limits, no S3, no S4: no clicks, no rubs, no murmurs ABD:  Soft, nontender. BS +, no masses or bruits. No hepatomegaly, no splenomegaly EXT:  2 + pulses throughout, no edema, no cyanosis no clubbing SKIN:  Warm and dry.  No rashes NEURO:  Alert and oriented x 3. Cranial nerves II through XII intact. PSYCH:  Cognitively intact     Labs reviewed from 06/06/16: Hgb 13.7. Otherwise CBC normal. Glucose 149. Otherwise CMET is normal. A1c 7.5%. Cholesterol 184, trig-155, HDL 50, LDL 103. TSH normal. Dated 12/12/16: cholesterol 188, triglycerides 306, HDL 52, LDL 75. Hgb 14. Glucose 169. A1c 7.8. Otherwise CMET normal. Dated 08/27/17: cholesterol 168, triglycerides 189, HDL 51, LDL 79. A1c 7.7%. Glucose 186. Other chemistries normal. hgb 13.9.  Dated 08/31/18: Hgb 13.7. glucose 162, other chemistries normal. A1c 7.6%. cholesterol 184, triglycerides 223, HDL 48. LDL 91.   Ecg today shows NSR rate 82. RBBB. I have personally reviewed and interpreted this study.   ASSESSMENT AND PLAN:  Problem List Items Addressed This Visit    HYPERCHOLESTEROLEMIA   Coronary atherosclerosis   Atrial flutter ([place]) - Primary    Other Visit Diagnoses    Morbid obesity due to excess calories ([place])         1. Atrial flutter- s/p ablation in 2014 without recurrence.  2. CAD with remote stent of the diagonal. He is asymptomatic. Continue ASA  3. Hypercholesterolemia. LDL 91. Ideally would like < 70.   Recommend he increase efforts at walking daily and weight loss. Continue crestor at maximally tolerated dose. Could consider a trial of Zetia if this has not been tried before.  4. Morbid obesity with OSA. On CPAP. Encourage weight loss.   5. Diabetes mellitus: Followed by primary. Again needs to focus on dietary modification and weight  loss.   follow-up in one year.

## 2018-09-08 ENCOUNTER — Telehealth: Payer: Self-pay

## 2018-09-08 ENCOUNTER — Encounter: Payer: Self-pay | Admitting: Internal Medicine

## 2018-09-08 NOTE — Telephone Encounter (Signed)
LMOM to bring CPAP and power cord to appointment tomorrow.

## 2018-09-09 ENCOUNTER — Ambulatory Visit (INDEPENDENT_AMBULATORY_CARE_PROVIDER_SITE_OTHER): Payer: Medicare Other | Admitting: Cardiology

## 2018-09-09 ENCOUNTER — Encounter: Payer: Self-pay | Admitting: Internal Medicine

## 2018-09-09 ENCOUNTER — Encounter: Payer: Self-pay | Admitting: Cardiology

## 2018-09-09 ENCOUNTER — Ambulatory Visit (INDEPENDENT_AMBULATORY_CARE_PROVIDER_SITE_OTHER): Payer: Medicare Other | Admitting: Internal Medicine

## 2018-09-09 VITALS — BP 130/60 | HR 72 | Ht 71.0 in | Wt 284.6 lb

## 2018-09-09 VITALS — BP 110/58 | HR 82 | Ht 71.0 in | Wt 282.0 lb

## 2018-09-09 DIAGNOSIS — E7800 Pure hypercholesterolemia, unspecified: Secondary | ICD-10-CM

## 2018-09-09 DIAGNOSIS — I2510 Atherosclerotic heart disease of native coronary artery without angina pectoris: Secondary | ICD-10-CM

## 2018-09-09 DIAGNOSIS — G4733 Obstructive sleep apnea (adult) (pediatric): Secondary | ICD-10-CM | POA: Diagnosis not present

## 2018-09-09 DIAGNOSIS — I483 Typical atrial flutter: Secondary | ICD-10-CM | POA: Diagnosis not present

## 2018-09-09 NOTE — Patient Instructions (Signed)
We can continue CPAP 15, mask of choice, humidifier, supplies, AirView  Please call if we can help

## 2018-09-09 NOTE — Assessment & Plan Note (Signed)
He is benefiting from CPAP with improved sleep and prevention of snoring as confirmed by his wife.  Download confirms appliance and control. Plan-continue CPAP 15

## 2018-09-09 NOTE — Progress Notes (Addendum)
HPI male former smoker followed here for OSA/CPAP and for mild COPD, complicated by DM 2, CAD/stent, a flutter/ablation PFT-12/21/15-minimal obstructive airways disease-FVC 3.82/95%, FEV1 3.23/110%, ratio 0.85, TLC 88%, DLCO 77% [facility] AHI 31.8/ hr,  desaturation to 81%, body weight 307 lbs  -------------------------------------------------------------------------------------  09/09/17- 78 year old male former smoker followed here for OSA/CPAP and for mild COPD, complicated by DM 2, CAD/stent, a flutter/ablation CPAP 15/ [facility] ----OSA; DME:[facility]. Pt wears CPAP nightly for at least 9-10 hours. Pressure works well for patient and no new supplies needed at this time.  No DL as patient did not bring SD Card. Pt not able to be put in AV.  Patient declines flu shot saying brother had Mauricio Po Despite long smoking history, chest feels fine he says with no cough or discomfort.  He got a good report from his cardiologist and is scheduled for 1 year follow-up there. Still reports he cannot sleep without his CPAP.  There has been no break in therapy.  He clearly benefits-sleeping better.  09/09/2018- 78 year old male former smoker followed here for OSA/CPAP and for mild COPD, complicated by DM 2, CAD/stent, a flutter/ablation CPAP 15/ [facility] -----OSA-yearly follow up with CPAP He states that he cannot sleep without CPAP and his wife confirms a completely prevents his snoring.  Machine is about 76 years old and is comfortable.  He does not feel any need for change. Getting over a mild cold on his own.  He declines flu shot-discussed. He reports stable and normal heart rhythm since ablation several years ago. CXR 09/09/2017- No active cardiopulmonary disease.  Aortic atherosclerosis.  ROS-see HPI   + = positive Constitutional:    weight loss, night sweats, fevers, chills, fatigue, lassitude. HEENT:    headaches, difficulty swallowing, tooth/dental problems, sore throat,     sneezing, itching, ear ache, +nasal congestion, post nasal drip, snoring CV:    chest pain, orthopnea, PND, swelling in lower extremities, anasarca,                                                  dizziness, palpitations Resp:   shortness of breath with exertion or at rest.                productive cough,   non-productive cough, coughing up of blood.              change in color of mucus.  wheezing.   Skin:    rash or lesions. GI:  No-   heartburn, indigestion, abdominal pain, nausea, vomiting, diarrhea,                 change in bowel habits, loss of appetite GU: dysuria, change in color of urine, no urgency or frequency.   flank pain. MS:   joint pain, stiffness, decreased range of motion, back pain. Neuro-     nothing unusual Psych:  change in mood or affect.  depression or anxiety.   memory loss.  OBJ- Physical Exam General- Alert, Oriented, Affect-appropriate, Distress- none acute, + obese Skin- rash-none, lesions- none, excoriation- none Lymphadenopathy- none Head- atraumatic            Eyes- Gross vision intact, PERRLA, conjunctivae and secretions clear            Ears- Hearing, canals-normal  Nose-+ minimal nasal congestion, no-Septal dev, mucus, polyps, erosion, perforation             Throat- Mallampati IV, mucosa clear , drainage- none, tonsils- atrophic, has lower teeth, otherwise edentulous Neck- flexible , trachea midline, no stridor , thyroid nl, carotid no bruit Chest - symmetrical excursion , unlabored           Heart/CV- RRR , no murmur , no gallop  , no rub, nl s1 s2                           - JVD- none , edema- none, stasis changes- none, varices- none           Lung- clear, wheeze- none, cough- none , dullness-none, rub- none           Chest wall-  Abd-  Br/ Gen/ Rectal- Not done, not indicated Extrem- cyanosis- none, clubbing, none, atrophy- none, strength- nl Neuro- grossly intact to observation

## 2018-09-09 NOTE — Assessment & Plan Note (Signed)
He has not been prepared to change his lifestyle sufficiently to accomplish meaningful weight loss.  Medical importance for his health problems is recognized.  Weight loss is encouraged.

## 2018-09-11 ENCOUNTER — Telehealth: Payer: Self-pay | Admitting: Internal Medicine

## 2018-09-13 ENCOUNTER — Other Ambulatory Visit: Payer: Self-pay | Admitting: Cardiology

## 2018-09-14 ENCOUNTER — Other Ambulatory Visit: Payer: Self-pay | Admitting: Cardiology

## 2018-09-15 NOTE — Telephone Encounter (Signed)
Trying to track down his old sleep study have called pt an his pcp to see if we can get it waiting on pcp to fax it to me Joellen Jersey

## 2018-09-15 NOTE — Telephone Encounter (Signed)
[facility] - please advise.

## 2018-09-16 NOTE — Telephone Encounter (Signed)
The patient had requested his sleep study from [facility]. We received it today and I faxed it to [facility] with [facility]. I received confirmation that the fax was received I have given this to Dr. Annamaria Boots to review and send to scan into the chart

## 2019-09-08 ENCOUNTER — Other Ambulatory Visit: Payer: Self-pay

## 2019-09-08 ENCOUNTER — Ambulatory Visit (INDEPENDENT_AMBULATORY_CARE_PROVIDER_SITE_OTHER): Payer: Medicare Other

## 2019-09-08 ENCOUNTER — Ambulatory Visit (INDEPENDENT_AMBULATORY_CARE_PROVIDER_SITE_OTHER): Payer: Medicare Other | Admitting: Internal Medicine

## 2019-09-08 ENCOUNTER — Encounter: Payer: Self-pay | Admitting: Internal Medicine

## 2019-09-08 VITALS — BP 118/80 | HR 70 | Temp 97.9°F | Ht 71.0 in | Wt 291.0 lb

## 2019-09-08 DIAGNOSIS — J449 Chronic obstructive pulmonary disease, unspecified: Secondary | ICD-10-CM

## 2019-09-08 DIAGNOSIS — G4733 Obstructive sleep apnea (adult) (pediatric): Secondary | ICD-10-CM

## 2019-09-08 DIAGNOSIS — I2510 Atherosclerotic heart disease of native coronary artery without angina pectoris: Secondary | ICD-10-CM | POA: Diagnosis not present

## 2019-09-08 NOTE — Assessment & Plan Note (Signed)
Note basilar crackles on exam. Denies cough. Plan- cxr

## 2019-09-08 NOTE — Assessment & Plan Note (Signed)
Benefits from CPAP, good compliance and control Plan- continue 15 cwp

## 2019-09-08 NOTE — Patient Instructions (Signed)
We can continue CPAP 15, mask of choice, humidifier, supplies, AirView/ card  Order- CXR   Dx COPD mixed type  Please call if we can help

## 2019-09-08 NOTE — Assessment & Plan Note (Signed)
Follows with cardiology 

## 2019-09-08 NOTE — Progress Notes (Signed)
HPI male former smoker followed here for OSA/CPAP and for mild COPD, complicated by DM 2, CAD/stent, a flutter/ablation PFT-12/21/15-minimal obstructive airways disease-FVC 3.82/95%, FEV1 3.23/110%, ratio 0.85, TLC 88%, DLCO 77% [facility]G- [facility] AHI 31.8/ hr,  desaturation to 81%, body weight 307 lbs  -------------------------------------------------------------------------------------   09/09/2018- 79 year old male former smoker followed here for OSA/CPAP and for mild COPD, complicated by DM 2, CAD/stent, a flutter/ablation CPAP 15/ [facility] -----OSA-yearly follow up with CPAP He states that he cannot sleep without CPAP and his wife confirms a completely prevents his snoring.  Machine is about 77 years old and is comfortable.  He does not feel any need for change. Getting over a mild cold on his own.  He declines flu shot-discussed. He reports stable and normal heart rhythm since ablation several years ago. CXR 09/09/2017- No active cardiopulmonary disease.  Aortic atherosclerosis.  09/08/19- 79 year old male former smoker followed here for OSA/CPAP and for mild COPD, complicated by DM 2, CAD/stent, a flutter/ablation CPAP 15/ [facility] Download- compliance 100%, AHI 0.7/ hr Body weight today 291 lbs Can't sleep w/o CPAP now. Follows with cardiology but no recent problem.   Declines flu vax. N Breathing baseline. No cough, no edema.  ROS-see HPI   + = positive Constitutional:    weight loss, night sweats, fevers, chills, fatigue, lassitude. HEENT:    headaches, difficulty swallowing, tooth/dental problems, sore throat,       sneezing, itching, ear ache, +nasal congestion, post nasal drip, snoring CV:    chest pain, orthopnea, PND, swelling in lower extremities, anasarca,                                                  dizziness, palpitations Resp:   shortness of breath with exertion or at rest.                productive cough,   non-productive cough, coughing up of blood.               change in color of mucus.  wheezing.   Skin:    rash or lesions. GI:  No-   heartburn, indigestion, abdominal pain, nausea, vomiting, diarrhea,                 change in bowel habits, loss of appetite GU: dysuria, change in color of urine, no urgency or frequency.   flank pain. MS:   joint pain, stiffness, decreased range of motion, back pain. Neuro-     nothing unusual Psych:  change in mood or affect.  depression or anxiety.   memory loss.  OBJ- Physical Exam General- Alert, Oriented, Affect-appropriate, Distress- none acute, + obese Skin- rash-none, lesions- none, excoriation- none Lymphadenopathy- none Head- atraumatic            Eyes- Gross vision intact, PERRLA, conjunctivae and secretions clear            Ears- Hearing, canals-normal            Nose-+ minimal nasal congestion, no-Septal dev, mucus, polyps, erosion, perforation             Throat- Mallampati IV, mucosa clear , drainage- none, tonsils- atrophic, has lower teeth, otherwise edentulous Neck- flexible , trachea midline, no stridor , thyroid nl, carotid no bruit Chest - symmetrical excursion , unlabored  Heart/CV- RRR , no murmur , no gallop  , no rub, nl s1 s2                           + JVD 1cm , edema- none, stasis changes- none, varices- none           Lung- + bibasilar crackles, wheeze- none, cough- none , dullness-none, rub- none           Chest wall-  Abd-  Br/ Gen/ Rectal- Not done, not indicated Extrem- cyanosis- none, clubbing, none, atrophy- none, strength- nl Neuro- grossly intact to observation

## 2019-09-10 ENCOUNTER — Ambulatory Visit: Payer: Medicare Other | Admitting: Internal Medicine

## 2019-09-19 NOTE — Progress Notes (Signed)
Patient ID: Jackson Parks, male   DOB: Dec 25, 1940, 79 y.o.   MRN: [id number]    Date:  09/21/2019   ID:  Jackson Parks, DOB [birth year], MRN [id number]  PCP:  [name], MD  Primary Cardiologist:  [place]  Chief Complaint  Patient presents with  . Follow-up    12 months.  . Fatigue     History of Present Illness: Jackson Parks is a 79 y.o. male with a history of atrial flutter status post DC cardioversion. He subsequently had an ablation procedure by Dr. [name] in April 2014. He has a history of coronary disease and is status post stenting of the diagonal branch in 2006. Cardiac catheterization in February 2014 showed continued patency of the stent and nonobstructive disease. He also had normal right heart pressures at that time. He does have obstructive sleep apnea and is on CPAP therapy.   On follow up today he is doing very well. He denies any chest pain, dyspnea, edema, or palpitations. 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   He is is taking low dose Crestor 3-5 x/week. He uses his CPAP every night. He did have a recent bout of bursitis in his hip that has improved after an injection.    Wt Readings from Last 3 Encounters:  09/21/19 288 lb (130.6 kg)  09/08/19 291 lb (132 kg)  09/09/18 284 lb 9.6 oz (129.1 kg)     Past Medical History:  Diagnosis Date  . Atrial flutter ([place]) 10/05/2012  . CAD (coronary artery disease)   . COPD (chronic obstructive pulmonary disease) ([place])   . Diabetes mellitus without complication ([place])   . DM (dermatomyositis)   . Hypercholesterolemia   . Obesity     Current Outpatient Medications  Medication Sig Dispense Refill  . acetaminophen (TYLENOL) 500 MG tablet Take 500 mg by mouth every 6 (six) hours as needed.    Marland Kitchen aspirin EC 81 MG tablet Take 81 mg by mouth daily.    Marland Kitchen Co-Enzyme Q-10 100 MG CAPS Take 50 mg by mouth daily.    Marland Kitchen glimepiride (AMARYL) 2 MG tablet Take 2 mg by mouth daily before breakfast.    .  lisinopril (PRINIVIL,ZESTRIL) 10 MG tablet TAKE 1 TABLET BY MOUTH  EVERY EVENING 90 tablet 3  . NONFORMULARY OR COMPOUNDED ITEM Apply 1-2 g topically 4 (four) times daily. 120 each 2  . rosuvastatin (CRESTOR) 5 MG tablet TAKE 1 TABLET FOR 5 DAYS A  WEEK ON MONDAY, TUESDAY,  WEDNESDAY, THURSDAY AND  FRIDAY 60 tablet 3  . sitaGLIPtan-metformin (JANUMET) 50-1000 MG per tablet Take 1 tablet by mouth 2 (two) times daily with a meal.    . traZODone (DESYREL) 100 MG tablet Take 1 tablet by mouth at bedtime.     Current Facility-Administered Medications  Medication Dose Route Frequency Provider Last Rate Last Admin  . betamethasone acetate-betamethasone sodium phosphate (CELESTONE) injection 3 mg  3 mg Intramuscular Once Edrick Kins, DPM        Allergies:    Allergies  Allergen Reactions  . Nsaids Other (See Comments)    Sever GI upset   . Statins     Muscle pain  . Tolmetin Other (See Comments)    Sever GI upset     Social History:  The patient  reports that he quit smoking about 18 years ago. His smoking use included cigarettes. He has a 117.00 pack-year smoking history. He has never used smokeless tobacco.  He reports current alcohol use. He reports that he does not use drugs.   Family history:   Family History  Problem Relation Age of Onset  . Heart failure Mother 68       deceased  . Heart attack Father 48       deceased  . Coronary artery disease Brother 51  . Peripheral vascular disease Brother 68    ROS:  Please see the history of present illness.  All other systems reviewed and negative.   PHYSICAL EXAM: VS:  BP 136/70 (BP Location: Right Arm, Patient Position: Sitting, Cuff Size: Large)   Pulse 75   Temp 98.2 F (36.8 C)   Ht 5\' 10"  (1.778 m)   Wt 288 lb (130.6 kg)   BMI 41.32 kg/m  GENERAL:  Well appearing, obese WM in NAD HEENT:  PERRL, EOMI, sclera are clear. Oropharynx is clear. NECK:  No jugular venous distention, carotid upstroke brisk and symmetric, no  bruits, no thyromegaly or adenopathy LUNGS:  Clear to auscultation bilaterally CHEST:  Unremarkable HEART:  RRR,  PMI not displaced or sustained,S1 and S2 within normal limits, no S3, no S4: no clicks, no rubs, no murmurs ABD:  Soft, nontender. BS +, no masses or bruits. No hepatomegaly, no splenomegaly EXT:  2 + pulses throughout, no edema, no cyanosis no clubbing SKIN:  Warm and dry.  No rashes NEURO:  Alert and oriented x 3. Cranial nerves II through XII intact. PSYCH:  Cognitively intact     Labs reviewed from 06/06/16: Hgb 13.7. Otherwise CBC normal. Glucose 149. Otherwise CMET is normal. A1c 7.5%. Cholesterol 184, trig-155, HDL 50, LDL 103. TSH normal. Dated 12/12/16: cholesterol 188, triglycerides 306, HDL 52, LDL 75. Hgb 14. Glucose 169. A1c 7.8. Otherwise CMET normal. Dated 08/27/17: cholesterol 168, triglycerides 189, HDL 51, LDL 79. A1c 7.7%. Glucose 186. Other chemistries normal. hgb 13.9.  Dated 08/31/18: Hgb 13.7. glucose 162, other chemistries normal. A1c 7.6%. cholesterol 184, triglycerides 223, HDL 48. LDL 91.  Dated 03/16/19: Hgb 12.8. glucose 149, sodium 135. Other Chemistries normal. A1c 7.2%. cholesterol 161, triglycerides 232, HDL 45, LDL 70.   Ecg today shows NSR rate 75. RBBB. I have personally reviewed and interpreted this study.   ASSESSMENT AND PLAN:  Problem List Items Addressed This Visit    HYPERCHOLESTEROLEMIA   Coronary atherosclerosis - Primary   Atrial flutter (HCC)   OSA (obstructive sleep apnea)    Other Visit Diagnoses    Morbid obesity due to excess calories ([place])         1. Atrial flutter- s/p ablation in 2014 without recurrence.  2. CAD with remote stent of the diagonal. He is asymptomatic. Continue ASA  3. Hypercholesterolemia. LDL 70 on low dose Crestor. Intolerant of higher dose.   4. Morbid obesity with OSA. On CPAP. Encourage weight loss.   5. Diabetes mellitus: Followed by primary. Again needs to focus on dietary modification and  weight loss. Last A1c 7.25.   follow-up in one year.

## 2019-09-21 ENCOUNTER — Ambulatory Visit (INDEPENDENT_AMBULATORY_CARE_PROVIDER_SITE_OTHER): Payer: Medicare Other | Admitting: Cardiology

## 2019-09-21 ENCOUNTER — Other Ambulatory Visit: Payer: Self-pay

## 2019-09-21 ENCOUNTER — Encounter: Payer: Self-pay | Admitting: Cardiology

## 2019-09-21 VITALS — BP 136/70 | HR 75 | Temp 98.2°F | Ht 70.0 in | Wt 288.0 lb

## 2019-09-21 DIAGNOSIS — I483 Typical atrial flutter: Secondary | ICD-10-CM | POA: Diagnosis not present

## 2019-09-21 DIAGNOSIS — G4733 Obstructive sleep apnea (adult) (pediatric): Secondary | ICD-10-CM

## 2019-09-21 DIAGNOSIS — I2510 Atherosclerotic heart disease of native coronary artery without angina pectoris: Secondary | ICD-10-CM | POA: Diagnosis not present

## 2019-09-21 DIAGNOSIS — E7800 Pure hypercholesterolemia, unspecified: Secondary | ICD-10-CM

## 2020-02-17 IMAGING — DX DG CHEST 2V
2 series · 2 of 2 positions shown · non-contrast
Comparison: 09/09/2017.

CLINICAL DATA: COPD.

EXAM:
CHEST - 2 VIEW

[chest pa]
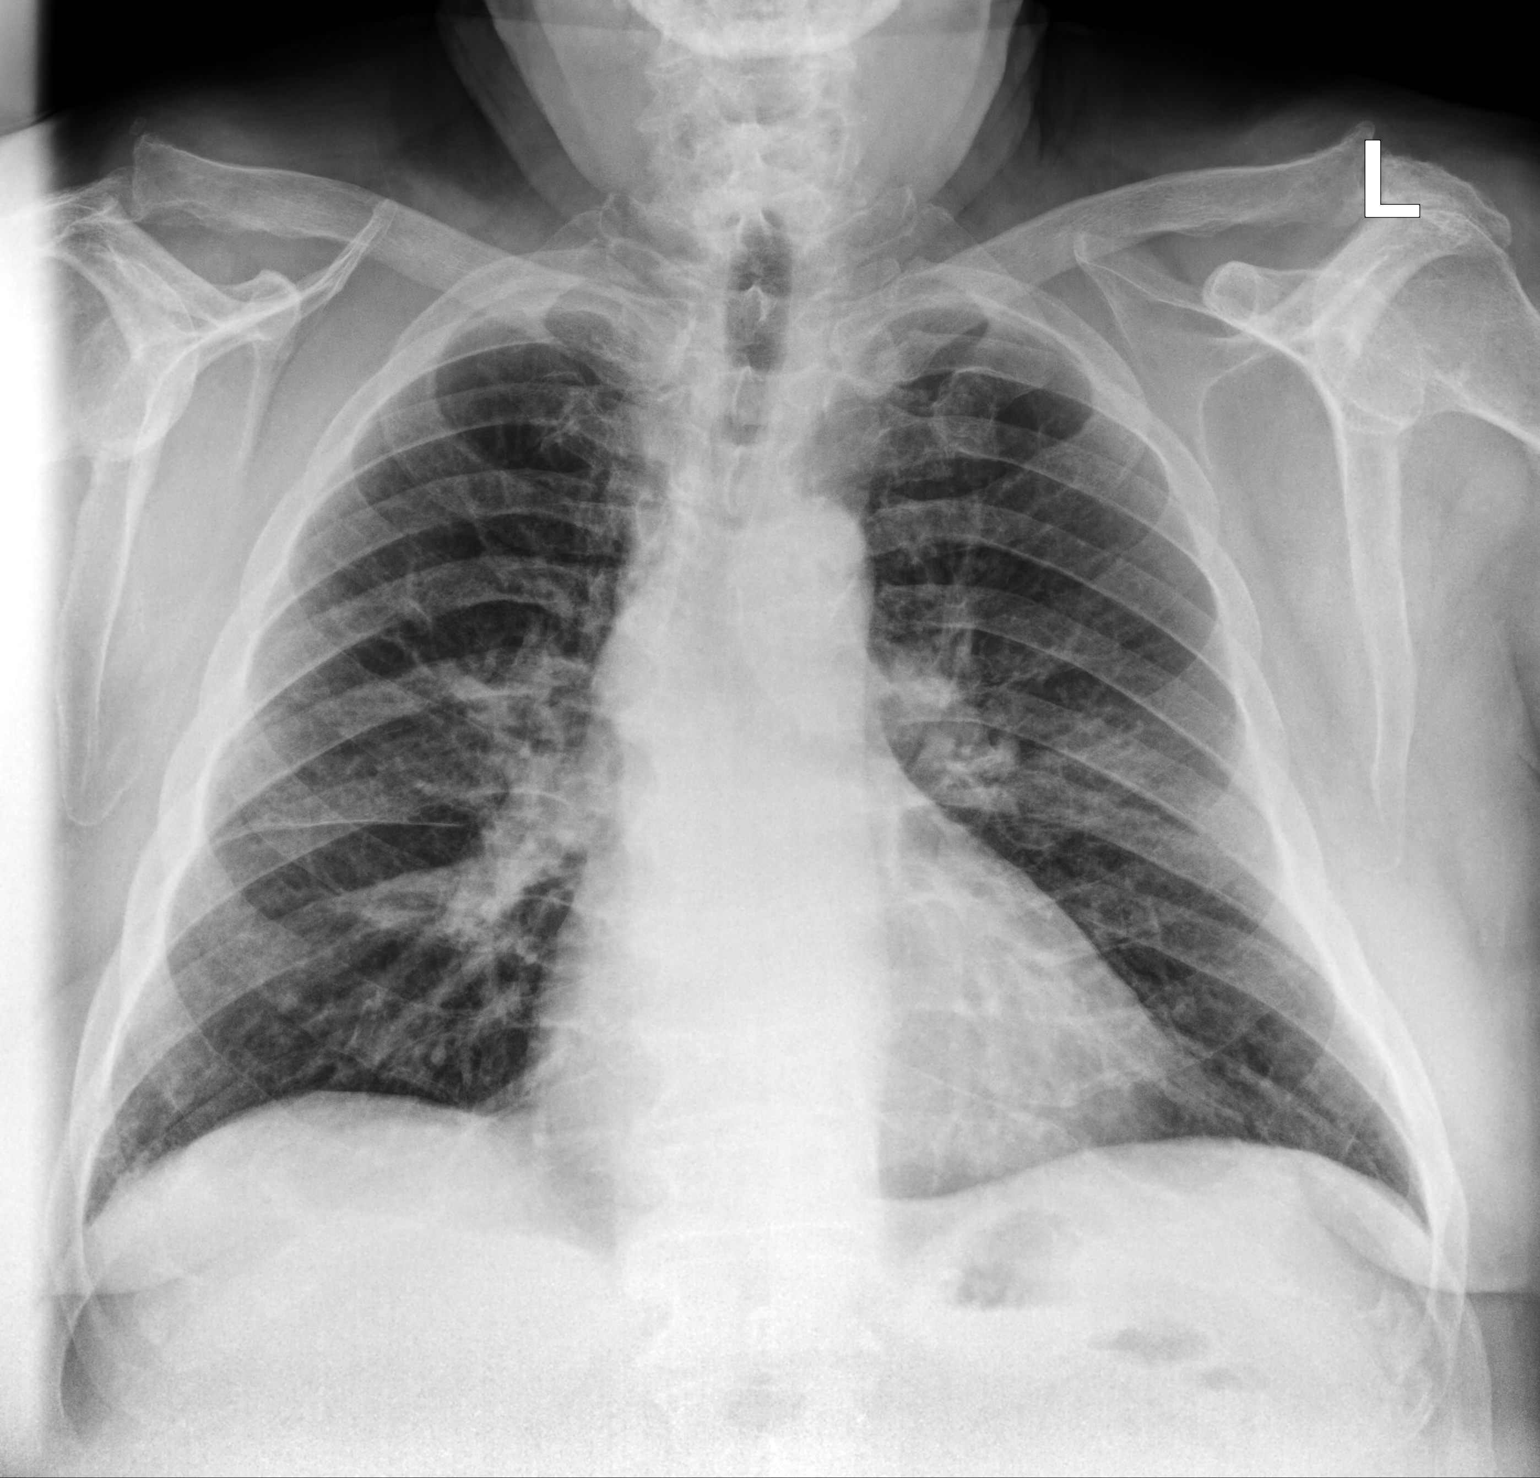

[chest lat]
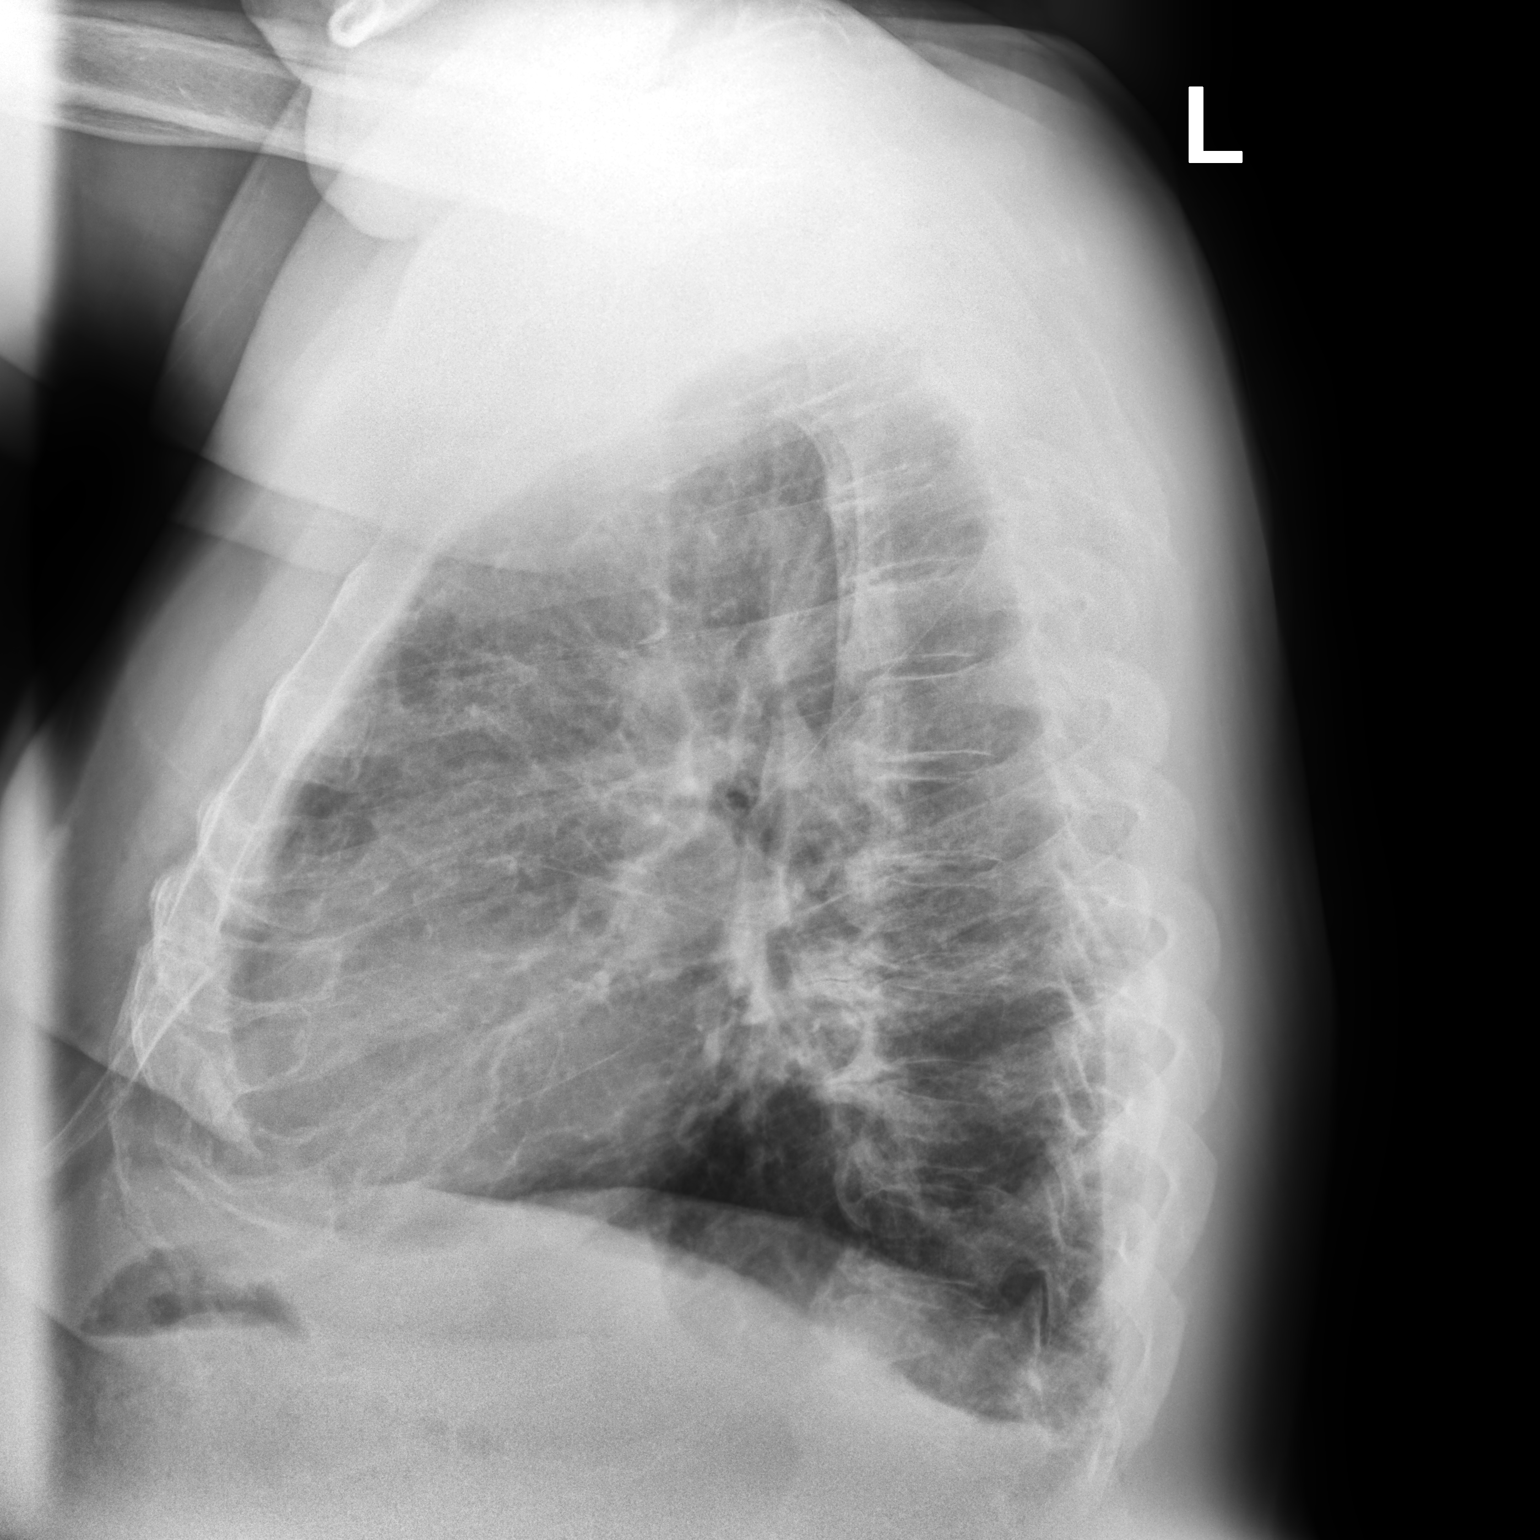

[2 of 2 positions shown; findings below may reference images not displayed]

FINDINGS: Trachea is midline. Heart size stable. Lungs are clear. No pleural
fluid. Degenerative changes in the spine.
IMPRESSION: No acute findings.

## 2020-07-04 DIAGNOSIS — H02102 Unspecified ectropion of right lower eyelid: Secondary | ICD-10-CM | POA: Insufficient documentation

## 2020-07-04 DIAGNOSIS — H5340 Unspecified visual field defects: Secondary | ICD-10-CM | POA: Insufficient documentation

## 2020-07-04 DIAGNOSIS — H02839 Dermatochalasis of unspecified eye, unspecified eyelid: Secondary | ICD-10-CM | POA: Insufficient documentation

## 2020-07-04 DIAGNOSIS — H02403 Unspecified ptosis of bilateral eyelids: Secondary | ICD-10-CM | POA: Insufficient documentation

## 2020-09-05 NOTE — Progress Notes (Signed)
HPI male former smoker followed here for OSA/CPAP and for mild COPD, complicated by DM 2, CAD/stent, a flutter/ablation PFT-12/21/15-minimal obstructive airways disease-FVC 3.82/95%, FEV1 3.23/110%, ratio 0.85, TLC 88%, DLCO 77% NPSG- [facility]- AHI 31.8/ hr,  desaturation to 81%, body weight 307 lbs  -------------------------------------------------------------------------------------   09/08/19- 80 year old male former smoker followed here for OSA/CPAP and for mild COPD, complicated by DM 2, CAD/stent, a flutter/ablation CPAP 15/ [employer] Download- compliance 100%, AHI 0.7/ hr Body weight today 291 lbs Can't sleep w/o CPAP now. Follows with cardiology but no recent problem.   Declines flu vax. N Breathing baseline. No cough, no edema.  09/06/20- 80 year old male former smoker followed here for OSA/CPAP and for mild COPD, complicated by DM 2, CAD/stent, a flutter/ablation CPAP 15/ [employer] Download- compliance 99%, AHI 0.6/ hr   New machine 2016 Body weight today- 270 lbs Covid vax- 2 Phizer Flu vax-declines -----Patient is feeling good, sleeping good, machine working good Doing well with full-face mask and says he can't sleep without his CPAP. Download reviewed. Dieting to lose weight. No acute issues. Breathing is stable. No listed inhalers. CXR 09/08/19- IMPRESSION: No acute findings.    ROS-see HPI   + = positive Constitutional:    +diet loss, night sweats, fevers, chills, fatigue, lassitude. HEENT:    headaches, difficulty swallowing, tooth/dental problems, sore throat,       sneezing, itching, ear ache, +nasal congestion, post nasal drip, snoring CV:    chest pain, orthopnea, PND, swelling in lower extremities, anasarca,                              dizziness, palpitations Resp:   shortness of breath with exertion or at rest.                productive cough,   non-productive cough, coughing up of blood.              change in color of mucus.  wheezing.    Skin:    rash or lesions. GI:  No-   heartburn, indigestion, abdominal pain, nausea, vomiting, diarrhea,                 change in bowel habits, loss of appetite GU: dysuria, change in color of urine, no urgency or frequency.   flank pain. MS:   joint pain, stiffness, decreased range of motion, back pain. Neuro-     nothing unusual Psych:  change in mood or affect.  depression or anxiety.   memory loss.  OBJ- Physical Exam General- Alert, Oriented, Affect-appropriate, Distress- none acute, + obese Skin- rash-none, lesions- none, excoriation- none Lymphadenopathy- none Head- atraumatic            Eyes- Gross vision intact, PERRLA, conjunctivae and secretions clear            Ears- Hearing, canals-normal            Nose-+ minimal nasal congestion, no-Septal dev, mucus, polyps, erosion, perforation             Throat- Mallampati IV, mucosa clear , drainage- none, tonsils- atrophic, has lower teeth, otherwise edentulous Neck- flexible , trachea midline, no stridor , thyroid nl, carotid no bruit Chest - symmetrical excursion , unlabored           Heart/CV- RRR , no murmur , no gallop  , no rub, nl s1 s2                           +  JVD 1cm , edema- none, stasis changes- none, varices- none           Lung- + bibasilar crackles, wheeze- none, cough- none , dullness-none, rub- none           Chest wall-  Abd-  Br/ Gen/ Rectal- Not done, not indicated Extrem- cyanosis- none, clubbing, none, atrophy- none, strength- nl Neuro- grossly intact to observation

## 2020-09-06 ENCOUNTER — Other Ambulatory Visit: Payer: Self-pay

## 2020-09-06 ENCOUNTER — Ambulatory Visit: Payer: Medicare HMO | Admitting: Internal Medicine

## 2020-09-06 ENCOUNTER — Encounter: Payer: Self-pay | Admitting: Internal Medicine

## 2020-09-06 DIAGNOSIS — G4733 Obstructive sleep apnea (adult) (pediatric): Secondary | ICD-10-CM

## 2020-09-06 DIAGNOSIS — J449 Chronic obstructive pulmonary disease, unspecified: Secondary | ICD-10-CM | POA: Diagnosis not present

## 2020-09-06 NOTE — Patient Instructions (Signed)
We can continue CPAP 15  I'm really glad you are doing so well !  Please call if we can help

## 2020-10-04 DIAGNOSIS — H02839 Dermatochalasis of unspecified eye, unspecified eyelid: Secondary | ICD-10-CM | POA: Diagnosis not present

## 2020-10-04 DIAGNOSIS — H1045 Other chronic allergic conjunctivitis: Secondary | ICD-10-CM | POA: Diagnosis not present

## 2020-10-04 DIAGNOSIS — H02889 Meibomian gland dysfunction of unspecified eye, unspecified eyelid: Secondary | ICD-10-CM | POA: Diagnosis not present

## 2020-10-04 DIAGNOSIS — H40013 Open angle with borderline findings, low risk, bilateral: Secondary | ICD-10-CM | POA: Diagnosis not present

## 2020-10-04 DIAGNOSIS — E119 Type 2 diabetes mellitus without complications: Secondary | ICD-10-CM | POA: Diagnosis not present

## 2020-10-10 ENCOUNTER — Ambulatory Visit: Payer: No Typology Code available for payment source | Admitting: Internal Medicine

## 2020-10-16 DIAGNOSIS — I2510 Atherosclerotic heart disease of native coronary artery without angina pectoris: Secondary | ICD-10-CM | POA: Diagnosis not present

## 2020-10-16 DIAGNOSIS — N529 Male erectile dysfunction, unspecified: Secondary | ICD-10-CM | POA: Insufficient documentation

## 2020-10-16 DIAGNOSIS — J449 Chronic obstructive pulmonary disease, unspecified: Secondary | ICD-10-CM | POA: Diagnosis not present

## 2020-10-16 DIAGNOSIS — I10 Essential (primary) hypertension: Secondary | ICD-10-CM | POA: Diagnosis not present

## 2020-10-16 DIAGNOSIS — E7800 Pure hypercholesterolemia, unspecified: Secondary | ICD-10-CM | POA: Diagnosis not present

## 2020-10-16 DIAGNOSIS — G4733 Obstructive sleep apnea (adult) (pediatric): Secondary | ICD-10-CM | POA: Diagnosis not present

## 2020-10-16 DIAGNOSIS — E119 Type 2 diabetes mellitus without complications: Secondary | ICD-10-CM | POA: Diagnosis not present

## 2020-10-21 NOTE — Progress Notes (Unsigned)
Patient ID: Jackson Parks, male   DOB: October 25, 1940, 80 y.o.   MRN: [id number]    Date:  10/25/2020   ID:  Jackson Parks, DOB [birth year], MRN [id number]  PCP:  Jackson Late, MD  Primary Cardiologist:  Jackson Parks  No chief complaint on file.    History of Present Illness: Jackson Parks is a 80 y.o. male with a history of atrial flutter status post DC cardioversion. He subsequently had an ablation procedure by Dr. Caryl Parks in April 2014. He has a history of coronary disease and is status post stenting of the diagonal branch in 2006. Cardiac catheterization in February 2014 showed continued patency of the stent and nonobstructive disease. He also had normal right heart pressures at that time. He does have obstructive sleep apnea and is on CPAP therapy.   On follow up today he is doing very well. He denies any chest pain, dyspnea, edema, or palpitations. He is active and just put up 720 lbs of sausage.   He is is taking low dose Crestor. He uses his CPAP every night. He denies any palpitations, dyspnea or chest pain. Reports he lost 30 lbs   Wt Readings from Last 3 Encounters:  10/25/20 273 lb 6.4 oz (124 kg)  09/06/20 270 lb 9.6 oz (122.7 kg)  09/21/19 288 lb (130.6 kg)     Past Medical History:  Diagnosis Date  . Atrial flutter ([place]) 10/05/2012  . CAD (coronary artery disease)   . COPD (chronic obstructive pulmonary disease) ([place])   . Diabetes mellitus without complication ([place])   . DM (dermatomyositis)   . Hypercholesterolemia   . Obesity     Current Outpatient Medications  Medication Sig Dispense Refill  . acetaminophen (TYLENOL) 500 MG tablet Take 500 mg by mouth every 6 (six) hours as needed.    Marland Kitchen aspirin EC 81 MG tablet Take 81 mg by mouth daily.    Marland Kitchen Co-Enzyme Q-10 100 MG CAPS Take 50 mg by mouth daily.    Marland Kitchen glimepiride (AMARYL) 2 MG tablet Take 2 mg by mouth daily before breakfast.    . lisinopril (PRINIVIL,ZESTRIL) 10 MG tablet TAKE 1 TABLET BY MOUTH  EVERY EVENING 90  tablet 3  . NONFORMULARY OR COMPOUNDED ITEM Apply 1-2 g topically 4 (four) times daily. 120 each 2  . rosuvastatin (CRESTOR) 5 MG tablet Take 1 tablet (5 mg total) by mouth daily. 90 tablet 3  . sitaGLIPtan-metformin (JANUMET) 50-1000 MG per tablet Take 1 tablet by mouth 2 (two) times daily with a meal.    . traZODone (DESYREL) 100 MG tablet Take 1 tablet by mouth at bedtime.     Current Facility-Administered Medications  Medication Dose Route Frequency Provider Last Rate Last Admin  . betamethasone acetate-betamethasone sodium phosphate (CELESTONE) injection 3 mg  3 mg Intramuscular Once Jackson Parks, DPM        Allergies:    Allergies  Allergen Reactions  . Nsaids Other (See Comments)    Sever GI upset   . Statins     Muscle pain  . Tolmetin Other (See Comments)    Sever GI upset     Social History:  The patient  reports that he quit smoking about 19 years ago. His smoking use included cigarettes. He has a 117.00 pack-year smoking history. He has never used smokeless tobacco. He reports current alcohol use. He reports that he does not use drugs.   Family history:   Family History  Problem Relation Age of Onset  . Heart  failure Mother 45       deceased  . Heart attack Father 63       deceased  . Coronary artery disease Brother 80  . Peripheral vascular disease Brother 68    ROS:  Please see the history of present illness.  All other systems reviewed and negative.   PHYSICAL EXAM: VS:  BP 130/75   Pulse 65   Ht 5\' 11"  (1.803 m)   Wt 273 lb 6.4 oz (124 kg)   SpO2 95%   BMI 38.13 kg/m  GENERAL:  Well appearing, obese WM in NAD HEENT:  PERRL, EOMI, sclera are clear. Oropharynx is clear. NECK:  No jugular venous distention, carotid upstroke brisk and symmetric, no bruits, no thyromegaly or adenopathy LUNGS:  Clear to auscultation bilaterally CHEST:  Unremarkable HEART:  RRR,  PMI not displaced or sustained,S1 and S2 within normal limits, no S3, no S4: no clicks, no  rubs, no murmurs ABD:  Soft, nontender. BS +, no masses or bruits. No hepatomegaly, no splenomegaly EXT:  2 + pulses throughout, no edema, no cyanosis no clubbing SKIN:  Warm and dry.  No rashes NEURO:  Alert and oriented x 3. Cranial nerves II through XII intact. PSYCH:  Cognitively intact     Labs reviewed from 06/06/16: Hgb 13.7. Otherwise CBC normal. Glucose 149. Otherwise CMET is normal. A1c 7.5%. Cholesterol 184, trig-155, HDL 50, LDL 103. TSH normal. Dated 12/12/16: cholesterol 188, triglycerides 306, HDL 52, LDL 75. Hgb 14. Glucose 169. A1c 7.8. Otherwise CMET normal. Dated 08/27/17: cholesterol 168, triglycerides 189, HDL 51, LDL 79. A1c 7.7%. Glucose 186. Other chemistries normal. hgb 13.9.  Dated 08/31/18: Hgb 13.7. glucose 162, other chemistries normal. A1c 7.6%. cholesterol 184, triglycerides 223, HDL 48. LDL 91.  Dated 03/16/19: Hgb 12.8. glucose 149, sodium 135. Other Chemistries normal. A1c 7.2%. cholesterol 161, triglycerides 232, HDL 45, LDL 70.  Dated 04/27/20: cholesterol 197, triglycerides 161, HDL 41, LDL 124. A1c 6.3%. Hgb 13.4. TSH and CMET normal  Ecg today shows NSR rate 65. RBBB. I have personally reviewed and interpreted this study.   ASSESSMENT AND PLAN:  Problem List Items Addressed This Visit    HYPERCHOLESTEROLEMIA   Relevant Medications   rosuvastatin (CRESTOR) 5 MG tablet   Atrial flutter ([facility]CC) - Primary   Relevant Medications   rosuvastatin (CRESTOR) 5 MG tablet   Other Relevant Orders   EKG 12-Lead    Other Visit Diagnoses    Morbid obesity due to excess calories ([place])         1. Atrial flutter- s/p ablation in 2014 without recurrence.  2. CAD with remote stent of the diagonal. He is asymptomatic. Continue ASA  3. Hypercholesterolemia. Goal LDL <70. Has been close to this before but LDL has crept up. Needs to focus on weight loss and healthy diet.  Intolerant of higher dose of Crestor  4. Morbid obesity with OSA. On CPAP. Encourage weight loss.  Followed by Dr Jackson Parks  5. Diabetes mellitus: Followed by primary. Again needs to focus on dietary modification and weight loss. Last A1c 6.3%   follow-up in one year.

## 2020-10-23 NOTE — Assessment & Plan Note (Signed)
Benefits from PAP with good compliance and control Plan- continue fixed pressure 15

## 2020-10-23 NOTE — Assessment & Plan Note (Signed)
Encouraged to stick to his goal of dieting for weight loss

## 2020-10-23 NOTE — Assessment & Plan Note (Signed)
Insignificant and asymptomatic currently without bronchodilators Plan- observation

## 2020-10-25 ENCOUNTER — Encounter: Payer: Self-pay | Admitting: Cardiology

## 2020-10-25 ENCOUNTER — Other Ambulatory Visit: Payer: Self-pay

## 2020-10-25 ENCOUNTER — Ambulatory Visit: Payer: Medicare HMO | Admitting: Cardiology

## 2020-10-25 VITALS — BP 130/75 | HR 65 | Ht 71.0 in | Wt 273.4 lb

## 2020-10-25 DIAGNOSIS — E7800 Pure hypercholesterolemia, unspecified: Secondary | ICD-10-CM

## 2020-10-25 DIAGNOSIS — I483 Typical atrial flutter: Secondary | ICD-10-CM

## 2020-10-25 MED ORDER — ROSUVASTATIN CALCIUM 5 MG PO TABS: 5.0000 mg | ORAL_TABLET | Freq: Every day | ORAL | 3 refills | Status: DC

## 2020-10-26 DIAGNOSIS — E1122 Type 2 diabetes mellitus with diabetic chronic kidney disease: Secondary | ICD-10-CM | POA: Diagnosis not present

## 2020-10-26 DIAGNOSIS — Z79899 Other long term (current) drug therapy: Secondary | ICD-10-CM | POA: Diagnosis not present

## 2020-10-26 DIAGNOSIS — E7800 Pure hypercholesterolemia, unspecified: Secondary | ICD-10-CM | POA: Diagnosis not present

## 2020-10-26 DIAGNOSIS — I129 Hypertensive chronic kidney disease with stage 1 through stage 4 chronic kidney disease, or unspecified chronic kidney disease: Secondary | ICD-10-CM | POA: Diagnosis not present

## 2020-10-26 DIAGNOSIS — N182 Chronic kidney disease, stage 2 (mild): Secondary | ICD-10-CM | POA: Diagnosis not present

## 2020-11-02 DIAGNOSIS — H6122 Impacted cerumen, left ear: Secondary | ICD-10-CM | POA: Diagnosis not present

## 2020-11-02 DIAGNOSIS — H02105 Unspecified ectropion of left lower eyelid: Secondary | ICD-10-CM | POA: Diagnosis not present

## 2020-11-02 DIAGNOSIS — H02834 Dermatochalasis of left upper eyelid: Secondary | ICD-10-CM | POA: Diagnosis not present

## 2020-11-02 DIAGNOSIS — J449 Chronic obstructive pulmonary disease, unspecified: Secondary | ICD-10-CM | POA: Diagnosis not present

## 2020-11-02 DIAGNOSIS — H02102 Unspecified ectropion of right lower eyelid: Secondary | ICD-10-CM | POA: Diagnosis not present

## 2020-11-02 DIAGNOSIS — I2510 Atherosclerotic heart disease of native coronary artery without angina pectoris: Secondary | ICD-10-CM | POA: Diagnosis not present

## 2020-11-02 DIAGNOSIS — E119 Type 2 diabetes mellitus without complications: Secondary | ICD-10-CM | POA: Diagnosis not present

## 2020-11-02 DIAGNOSIS — H02403 Unspecified ptosis of bilateral eyelids: Secondary | ICD-10-CM | POA: Diagnosis not present

## 2020-11-02 DIAGNOSIS — H57813 Brow ptosis, bilateral: Secondary | ICD-10-CM | POA: Diagnosis not present

## 2020-11-02 DIAGNOSIS — H02831 Dermatochalasis of right upper eyelid: Secondary | ICD-10-CM | POA: Diagnosis not present

## 2020-11-13 DIAGNOSIS — M791 Myalgia, unspecified site: Secondary | ICD-10-CM | POA: Diagnosis not present

## 2020-11-13 DIAGNOSIS — I129 Hypertensive chronic kidney disease with stage 1 through stage 4 chronic kidney disease, or unspecified chronic kidney disease: Secondary | ICD-10-CM | POA: Diagnosis not present

## 2020-11-13 DIAGNOSIS — G4733 Obstructive sleep apnea (adult) (pediatric): Secondary | ICD-10-CM | POA: Diagnosis not present

## 2020-11-13 DIAGNOSIS — I2510 Atherosclerotic heart disease of native coronary artery without angina pectoris: Secondary | ICD-10-CM | POA: Diagnosis not present

## 2020-11-13 DIAGNOSIS — T466X5A Adverse effect of antihyperlipidemic and antiarteriosclerotic drugs, initial encounter: Secondary | ICD-10-CM | POA: Diagnosis not present

## 2020-11-13 DIAGNOSIS — E1122 Type 2 diabetes mellitus with diabetic chronic kidney disease: Secondary | ICD-10-CM | POA: Diagnosis not present

## 2020-11-13 DIAGNOSIS — E785 Hyperlipidemia, unspecified: Secondary | ICD-10-CM | POA: Diagnosis not present

## 2020-11-13 DIAGNOSIS — N182 Chronic kidney disease, stage 2 (mild): Secondary | ICD-10-CM | POA: Diagnosis not present

## 2020-11-13 DIAGNOSIS — J449 Chronic obstructive pulmonary disease, unspecified: Secondary | ICD-10-CM | POA: Diagnosis not present

## 2020-12-08 DIAGNOSIS — G4733 Obstructive sleep apnea (adult) (pediatric): Secondary | ICD-10-CM | POA: Diagnosis not present

## 2021-02-26 DIAGNOSIS — H6122 Impacted cerumen, left ear: Secondary | ICD-10-CM | POA: Diagnosis not present

## 2021-04-29 DIAGNOSIS — M1711 Unilateral primary osteoarthritis, right knee: Secondary | ICD-10-CM | POA: Diagnosis not present

## 2021-05-14 DIAGNOSIS — M1711 Unilateral primary osteoarthritis, right knee: Secondary | ICD-10-CM | POA: Diagnosis not present

## 2021-05-14 DIAGNOSIS — M1712 Unilateral primary osteoarthritis, left knee: Secondary | ICD-10-CM | POA: Diagnosis not present

## 2021-05-16 ENCOUNTER — Ambulatory Visit: Payer: Medicare HMO | Admitting: Dermatology

## 2021-05-16 ENCOUNTER — Encounter: Payer: Self-pay | Admitting: Dermatology

## 2021-05-16 ENCOUNTER — Other Ambulatory Visit: Payer: Self-pay

## 2021-05-16 DIAGNOSIS — C44619 Basal cell carcinoma of skin of left upper limb, including shoulder: Secondary | ICD-10-CM | POA: Diagnosis not present

## 2021-05-16 DIAGNOSIS — L820 Inflamed seborrheic keratosis: Secondary | ICD-10-CM | POA: Diagnosis not present

## 2021-05-16 DIAGNOSIS — L578 Other skin changes due to chronic exposure to nonionizing radiation: Secondary | ICD-10-CM

## 2021-05-16 DIAGNOSIS — C4491 Basal cell carcinoma of skin, unspecified: Secondary | ICD-10-CM

## 2021-05-16 DIAGNOSIS — D485 Neoplasm of uncertain behavior of skin: Secondary | ICD-10-CM

## 2021-05-16 HISTORY — DX: Basal cell carcinoma of skin, unspecified: C44.91

## 2021-05-16 NOTE — Progress Notes (Signed)
   New Patient Visit  Subjective  Jackson Parks is a 80 y.o. male who presents for the following: Other (Spot of left shoulder x ~6 months).  He also has other areas of concern on his shoulders and back.  The following portions of the chart were reviewed this encounter and updated as appropriate:   Tobacco  Allergies  Meds  Problems  Med Hx  Surg Hx  Fam Hx     Review of Systems:  No other skin or systemic complaints except as noted in HPI or Assessment and Plan.  Objective  Well appearing patient in no apparent distress; mood and affect are within normal limits.  A focused examination was performed including face, trunk. Relevant physical exam findings are noted in the Assessment and Plan.  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      Assessment & Plan  Neoplasm of uncertain behavior of skin Left Shoulder - Anterior  Skin / nail biopsy Type of biopsy: tangential   Informed consent: discussed and consent obtained   Timeout: patient name, date of birth, surgical site, and procedure verified   Procedure prep:  Patient was prepped and draped in usual sterile fashion Prep type:  Isopropyl alcohol Anesthesia: the lesion was anesthetized in a standard fashion   Anesthetic:  1% lidocaine w/ epinephrine 1-100,000 buffered w/ 8.4% NaHCO3 Instrument used: flexible razor blade   Hemostasis achieved with: pressure, aluminum chloride and electrodesiccation   Outcome: patient tolerated procedure well   Post-procedure details: sterile dressing applied and wound care instructions given   Dressing type: bandage and petrolatum    Specimen 1 - Surgical pathology Differential Diagnosis: BCC vs other  Check Margins: No  Inflamed seborrheic keratosis Right post lat shoulder  Recheck on follow up  Destruction of lesion - Right post lat shoulder Complexity: simple   Destruction  method: cryotherapy   Informed consent: discussed and consent obtained   Timeout:  patient name, date of birth, surgical site, and procedure verified Lesion destroyed using liquid nitrogen: Yes   Region frozen until ice ball extended beyond lesion: Yes   Outcome: patient tolerated procedure well with no complications   Post-procedure details: wound care instructions given    Return Pending biopsy results.  Actinic Damage - chronic, secondary to cumulative UV radiation exposure/sun exposure over time - diffuse scaly erythematous macules with underlying dyspigmentation - Recommend daily broad spectrum sunscreen SPF 30+ to sun-exposed areas, reapply every 2 hours as needed.  - Recommend staying in the shade or wearing long sleeves, sun glasses (UVA+UVB protection) and wide brim hats (4-inch brim around the entire circumference of the hat). - Call for new or changing lesions.  I, Ashok Cordia, CMA, am acting as scribe for Sarina Ser, MD . Documentation: I have reviewed the above documentation for accuracy and completeness, and I agree with the above.  Sarina Ser, MD

## 2021-05-16 NOTE — Patient Instructions (Signed)
Cryotherapy Aftercare  Wash gently with soap and water everyday.   Apply Vaseline and Band-Aid daily until healed.    Wound Care Instructions  Cleanse wound gently with soap and water once a day then pat dry with clean gauze. Apply a thing coat of Petrolatum (petroleum jelly, "Vaseline") over the wound (unless you have an allergy to this). We recommend that you use a new, sterile tube of Vaseline. Do not pick or remove scabs. Do not remove the yellow or white "healing tissue" from the base of the wound.  Cover the wound with fresh, clean, nonstick gauze and secure with paper tape. You may use Band-Aids in place of gauze and tape if the would is small enough, but would recommend trimming much of the tape off as there is often too much. Sometimes Band-Aids can irritate the skin.  You should call the office for your biopsy report after 1 week if you have not already been contacted.  If you experience any problems, such as abnormal amounts of bleeding, swelling, significant bruising, significant pain, or evidence of infection, please call the office immediately.  FOR ADULT SURGERY PATIENTS: If you need something for pain relief you may take 1 extra strength Tylenol (acetaminophen) AND 2 Ibuprofen (200mg each) together every 4 hours as needed for pain. (do not take these if you are allergic to them or if you have a reason you should not take them.) Typically, you may only need pain medication for 1 to 3 days.     If you have any questions or concerns for your doctor, please call our main line at [phone] and press option 4 to reach your doctor's medical assistant. If no one answers, please leave a voicemail as directed and we will return your call as soon as possible. Messages left after 4 pm will be answered the following business day.   You may also send us a message via MyChart. We typically respond to MyChart messages within 1-2 business days.  For prescription refills, please ask your  pharmacy to contact our office. Our fax number is [phone].  If you have an urgent issue when the clinic is closed that cannot wait until the next business day, you can page your doctor at the number below.    Please note that while we do our best to be available for urgent issues outside of office hours, we are not available 24/7.   If you have an urgent issue and are unable to reach us, you may choose to seek medical care at your doctor's office, retail clinic, urgent care center, or emergency room.  If you have a medical emergency, please immediately call 911 or go to the emergency department.  Pager Numbers  - Dr. Kowalski: [phone]  - Dr. Moye: [phone]  - Dr. Stewart: [phone]  In the event of inclement weather, please call our main line at [phone] for an update on the status of any delays or closures.  Dermatology Medication Tips: Please keep the boxes that topical medications come in in order to help keep track of the instructions about where and how to use these. Pharmacies typically print the medication instructions only on the boxes and not directly on the medication tubes.   If your medication is too expensive, please contact our office at [phone] option 4 or send us a message through MyChart.   We are unable to tell what your co-pay for medications will be in advance as this is different depending on your insurance coverage.   However, we may be able to find a substitute medication at lower cost or fill out paperwork to get insurance to cover a needed medication.   If a prior authorization is required to get your medication covered by your insurance company, please allow us 1-2 business days to complete this process.  Drug prices often vary depending on where the prescription is filled and some pharmacies may offer cheaper prices.  The website www.goodrx.com contains coupons for medications through different pharmacies. The prices here do not  account for what the cost may be with help from insurance (it may be cheaper with your insurance), but the website can give you the price if you did not use any insurance.  - You can print the associated coupon and take it with your prescription to the pharmacy.  - You may also stop by our office during regular business hours and pick up a GoodRx coupon card.  - If you need your prescription sent electronically to a different pharmacy, notify our office through [facility] MyChart or by phone at [phone] option 4.  

## 2021-05-17 ENCOUNTER — Ambulatory Visit: Payer: Medicare HMO | Admitting: Dermatology

## 2021-05-22 ENCOUNTER — Telehealth: Payer: Self-pay

## 2021-05-22 NOTE — Telephone Encounter (Signed)
Patient informed of pathology results and appointment scheduled.  °

## 2021-05-22 NOTE — Telephone Encounter (Signed)
-----   Message from Ralene Bathe, MD sent at 05/21/2021  4:58 PM EDT ----- Diagnosis Skin , left shoulder-anterior BASAL CELL CARCINOMA, NODULAR PATTERN  Cancer - BCC Schedule surgery

## 2021-05-29 ENCOUNTER — Ambulatory Visit: Payer: Medicare HMO | Admitting: Dermatology

## 2021-05-29 ENCOUNTER — Other Ambulatory Visit: Payer: Self-pay

## 2021-05-29 ENCOUNTER — Encounter: Payer: Self-pay | Admitting: Dermatology

## 2021-05-29 DIAGNOSIS — D485 Neoplasm of uncertain behavior of skin: Secondary | ICD-10-CM

## 2021-05-29 DIAGNOSIS — D2262 Melanocytic nevi of left upper limb, including shoulder: Secondary | ICD-10-CM | POA: Diagnosis not present

## 2021-05-29 DIAGNOSIS — D045 Carcinoma in situ of skin of trunk: Secondary | ICD-10-CM | POA: Diagnosis not present

## 2021-05-29 DIAGNOSIS — C44619 Basal cell carcinoma of skin of left upper limb, including shoulder: Secondary | ICD-10-CM

## 2021-05-29 DIAGNOSIS — L905 Scar conditions and fibrosis of skin: Secondary | ICD-10-CM | POA: Diagnosis not present

## 2021-05-29 MED ORDER — DOXYCYCLINE HYCLATE 100 MG PO TABS: 100.0000 mg | ORAL_TABLET | Freq: Two times a day (BID) | ORAL | 0 refills | Status: AC

## 2021-05-29 MED ORDER — MUPIROCIN 2 % EX OINT
1.0000 | TOPICAL_OINTMENT | Freq: Every day | CUTANEOUS | 0 refills | Status: DC
Start: 2021-05-29 — End: 2023-01-13

## 2021-05-29 NOTE — Progress Notes (Signed)
Follow-Up Visit   Subjective  Jackson Parks is a 80 y.o. male who presents for the following: Basal Cell Carcinoma (Biopsy proven - left ant shoulder - [facility] today). Accompanied by wife  The following portions of the chart were reviewed this encounter and updated as appropriate:   Tobacco  Allergies  Meds  Problems  Med Hx  Surg Hx  Fam Hx     Review of Systems:  No other skin or systemic complaints except as noted in HPI or Assessment and Plan.  Objective  Well appearing patient in no apparent distress; mood and affect are within normal limits.  A focused examination was performed including left shoulder. Relevant physical exam findings are noted in the Assessment and Plan.  Left Shoulder - Anterior Healing biopsy site  Left mid infraclavicular Pink patch   Assessment & Plan  Basal cell carcinoma (BCC) of skin of left upper extremity including shoulder Left Shoulder - Anterior  Skin excision  Lesion length (cm):  6 Lesion width (cm):  2.8 Margin per side (cm):  0.2 Total excision diameter (cm):  6.4 Informed consent: discussed and consent obtained   Timeout: patient name, date of birth, surgical site, and procedure verified   Procedure prep:  Patient was prepped and draped in usual sterile fashion Prep type:  Isopropyl alcohol and povidone-iodine Anesthesia: the lesion was anesthetized in a standard fashion   Anesthetic:  1% lidocaine w/ epinephrine 1-100,000 buffered w/ 8.4% NaHCO3 Instrument used: #15 blade   Hemostasis achieved with: suture and pressure   Hemostasis achieved with comment:  Electrocautery Outcome: patient tolerated procedure well with no complications   Post-procedure details: sterile dressing applied and wound care instructions given   Dressing type: bandage and pressure dressing (mupirocin)    Skin repair Complexity:  Complex Final length (cm):  8 Reason for type of repair: reduce tension to allow closure, reduce the risk of  dehiscence, infection, and necrosis, reduce subcutaneous dead space and avoid a hematoma, allow closure of the large defect, preserve normal anatomy, preserve normal anatomical and functional relationships and enhance both functionality and cosmetic results   Undermining: area extensively undermined   Undermining comment:  Undermining defect 3.2 cm Subcutaneous layers (deep stitches):  Suture size:  2-0 Suture type: Vicryl (polyglactin 910)   Subcutaneous suture technique: inverted dermal. Fine/surface layer approximation (top stitches):  Suture size:  3-0 Suture type: nylon   Stitches: simple running   Suture removal (days):  7 Hemostasis achieved with: suture and pressure Outcome: patient tolerated procedure well with no complications   Post-procedure details: sterile dressing applied and wound care instructions given   Dressing type: bandage and pressure dressing (mupirocin)    mupirocin ointment (BACTROBAN) 2 % Apply 1 application topically daily. With dressing changes  doxycycline (VIBRA-TABS) 100 MG tablet Take 1 tablet (100 mg total) by mouth 2 (two) times daily. With food and plenty of fluid  Specimen 1 - Surgical pathology Differential Diagnosis: Biopsy proven BCC Check Margins: Yes Healing bx site, 6.0 x 2.8cm Tag 12:00 superior [id number]  Start Doxycycline 100 mg 1 po bid x 10 days with food and plenty of fluid  Neoplasm of uncertain behavior of skin Left mid infraclavicular Will plan plan biopsy at follow up appointment  Return in about 1 week (around 06/05/2021) for suture removal.  I, Ashok Cordia, CMA, am acting as scribe for Sarina Ser, MD . Documentation: I have reviewed the above documentation for accuracy and completeness, and I agree with the above.  Sarina Ser, MD

## 2021-05-29 NOTE — Patient Instructions (Signed)
Wound Care Instructions  Cleanse wound gently with soap and water once a day then pat dry with clean gauze. Apply a thing coat of Petrolatum (petroleum jelly, "Vaseline") over the wound (unless you have an allergy to this). We recommend that you use a new, sterile tube of Vaseline. Do not pick or remove scabs. Do not remove the yellow or white "healing tissue" from the base of the wound.  Cover the wound with fresh, clean, nonstick gauze and secure with paper tape. You may use Band-Aids in place of gauze and tape if the would is small enough, but would recommend trimming much of the tape off as there is often too much. Sometimes Band-Aids can irritate the skin.  You should call the office for your biopsy report after 1 week if you have not already been contacted.  If you experience any problems, such as abnormal amounts of bleeding, swelling, significant bruising, significant pain, or evidence of infection, please call the office immediately.  FOR ADULT SURGERY PATIENTS: If you need something for pain relief you may take 1 extra strength Tylenol (acetaminophen) AND 2 Ibuprofen (200mg each) together every 4 hours as needed for pain. (do not take these if you are allergic to them or if you have a reason you should not take them.) Typically, you may only need pain medication for 1 to 3 days.   If you have any questions or concerns for your doctor, please call our main line at [phone] and press option 4 to reach your doctor's medical assistant. If no one answers, please leave a voicemail as directed and we will return your call as soon as possible. Messages left after 4 pm will be answered the following business day.   You may also send us a message via MyChart. We typically respond to MyChart messages within 1-2 business days.  For prescription refills, please ask your pharmacy to contact our office. Our fax number is [phone].  If you have an urgent issue when the clinic is closed that  cannot wait until the next business day, you can page your doctor at the number below.    Please note that while we do our best to be available for urgent issues outside of office hours, we are not available 24/7.   If you have an urgent issue and are unable to reach us, you may choose to seek medical care at your doctor's office, retail clinic, urgent care center, or emergency room.  If you have a medical emergency, please immediately call 911 or go to the emergency department.  Pager Numbers  - Dr. Kowalski: [phone]  - Dr. Moye: [phone]  - Dr. Stewart: [phone]  In the event of inclement weather, please call our main line at [phone] for an update on the status of any delays or closures.  Dermatology Medication Tips: Please keep the boxes that topical medications come in in order to help keep track of the instructions about where and how to use these. Pharmacies typically print the medication instructions only on the boxes and not directly on the medication tubes.   If your medication is too expensive, please contact our office at [phone] option 4 or send us a message through MyChart.   We are unable to tell what your co-pay for medications will be in advance as this is different depending on your insurance coverage. However, we may be able to find a substitute medication at lower cost or fill out paperwork to get insurance to cover a needed   medication.   If a prior authorization is required to get your medication covered by your insurance company, please allow us 1-2 business days to complete this process.  Drug prices often vary depending on where the prescription is filled and some pharmacies may offer cheaper prices.  The website www.goodrx.com contains coupons for medications through different pharmacies. The prices here do not account for what the cost may be with help from insurance (it may be cheaper with your insurance), but the website can give you the  price if you did not use any insurance.  - You can print the associated coupon and take it with your prescription to the pharmacy.  - You may also stop by our office during regular business hours and pick up a GoodRx coupon card.  - If you need your prescription sent electronically to a different pharmacy, notify our office through [facility] MyChart or by phone at [phone] option 4.   

## 2021-05-30 ENCOUNTER — Telehealth: Payer: Self-pay

## 2021-05-30 NOTE — Telephone Encounter (Signed)
Left message for patient regarding surgery/hd  

## 2021-06-05 ENCOUNTER — Other Ambulatory Visit: Payer: Self-pay

## 2021-06-05 ENCOUNTER — Ambulatory Visit (INDEPENDENT_AMBULATORY_CARE_PROVIDER_SITE_OTHER): Payer: Medicare HMO | Admitting: Dermatology

## 2021-06-05 DIAGNOSIS — L905 Scar conditions and fibrosis of skin: Secondary | ICD-10-CM | POA: Diagnosis not present

## 2021-06-05 DIAGNOSIS — D045 Carcinoma in situ of skin of trunk: Secondary | ICD-10-CM

## 2021-06-05 DIAGNOSIS — D485 Neoplasm of uncertain behavior of skin: Secondary | ICD-10-CM

## 2021-06-05 DIAGNOSIS — Z85828 Personal history of other malignant neoplasm of skin: Secondary | ICD-10-CM

## 2021-06-05 NOTE — Progress Notes (Signed)
   Follow-Up Visit   Subjective  Jackson Parks is a 80 y.o. male who presents for the following: post op/suture removal (Of the L ant shoulder - pathology proven margins free BCC, patient is here today for suture removal.) and irregular skin lesion (Of the L mid infraclavicular - biopsy today ).  The following portions of the chart were reviewed this encounter and updated as appropriate:   Tobacco  Allergies  Meds  Problems  Med Hx  Surg Hx  Fam Hx     Review of Systems:  No other skin or systemic complaints except as noted in HPI or Assessment and Plan.  Objective  Well appearing patient in no apparent distress; mood and affect are within normal limits.  A focused examination was performed including the face, trunk, and extremities. Relevant physical exam findings are noted in the Assessment and Plan.  L mid infra clavicular 1.5 cm pink shiny patch.       L ant shoulder Healing excision site.   Assessment & Plan  Neoplasm of uncertain behavior of skin L mid infra clavicular  Skin / nail biopsy Type of biopsy: tangential   Informed consent: discussed and consent obtained   Timeout: patient name, date of birth, surgical site, and procedure verified   Procedure prep:  Patient was prepped and draped in usual sterile fashion Prep type:  Isopropyl alcohol Anesthesia: the lesion was anesthetized in a standard fashion   Anesthetic:  1% lidocaine w/ epinephrine 1-100,000 buffered w/ 8.4% NaHCO3 Instrument used: flexible razor blade   Hemostasis achieved with: pressure, aluminum chloride and electrodesiccation   Outcome: patient tolerated procedure well   Post-procedure details: sterile dressing applied and wound care instructions given   Dressing type: bandage and petrolatum    Specimen 1 - Surgical pathology Differential Diagnosis: D48.5 r/o BCC  Check Margins: No  History of basal cell carcinoma (BCC) L ant shoulder  Encounter for Removal of Sutures -  Incision site at the L ant shoulder is clean, dry and intact - Wound cleansed, sutures removed, wound cleansed and steri strips applied.  - Discussed pathology results showing margins free BCC  - Patient advised to keep steri-strips dry until they fall off. - Scars remodel for a full year. - Once steri-strips fall off, patient can apply over-the-counter silicone scar cream each night to help with scar remodeling if desired. - Patient advised to call with any concerns or if they notice any new or changing lesions.   Return in about 6 months (around 12/04/2021) for TBSE.  Luther Redo, CMA, am acting as scribe for Sarina Ser, MD . Documentation: I have reviewed the above documentation for accuracy and completeness, and I agree with the above.  Sarina Ser, MD

## 2021-06-05 NOTE — Patient Instructions (Signed)
Wound Care Instructions  Cleanse wound gently with soap and water once a day then pat dry with clean gauze. Apply a thing coat of Petrolatum (petroleum jelly, "Vaseline") over the wound (unless you have an allergy to this). We recommend that you use a new, sterile tube of Vaseline. Do not pick or remove scabs. Do not remove the yellow or white "healing tissue" from the base of the wound.  Cover the wound with fresh, clean, nonstick gauze and secure with paper tape. You may use Band-Aids in place of gauze and tape if the would is small enough, but would recommend trimming much of the tape off as there is often too much. Sometimes Band-Aids can irritate the skin.  You should call the office for your biopsy report after 1 week if you have not already been contacted.  If you experience any problems, such as abnormal amounts of bleeding, swelling, significant bruising, significant pain, or evidence of infection, please call the office immediately.  FOR ADULT SURGERY PATIENTS: If you need something for pain relief you may take 1 extra strength Tylenol (acetaminophen) AND 2 Ibuprofen (200mg each) together every 4 hours as needed for pain. (do not take these if you are allergic to them or if you have a reason you should not take them.) Typically, you may only need pain medication for 1 to 3 days.   If you have any questions or concerns for your doctor, please call our main line at [phone] and press option 4 to reach your doctor's medical assistant. If no one answers, please leave a voicemail as directed and we will return your call as soon as possible. Messages left after 4 pm will be answered the following business day.   You may also send us a message via MyChart. We typically respond to MyChart messages within 1-2 business days.  For prescription refills, please ask your pharmacy to contact our office. Our fax number is [phone].  If you have an urgent issue when the clinic is closed that  cannot wait until the next business day, you can page your doctor at the number below.    Please note that while we do our best to be available for urgent issues outside of office hours, we are not available 24/7.   If you have an urgent issue and are unable to reach us, you may choose to seek medical care at your doctor's office, retail clinic, urgent care center, or emergency room.  If you have a medical emergency, please immediately call 911 or go to the emergency department.  Pager Numbers  - Dr. Kowalski: [phone]  - Dr. Moye: [phone]  - Dr. Stewart: [phone]  In the event of inclement weather, please call our main line at [phone] for an update on the status of any delays or closures.  Dermatology Medication Tips: Please keep the boxes that topical medications come in in order to help keep track of the instructions about where and how to use these. Pharmacies typically print the medication instructions only on the boxes and not directly on the medication tubes.   If your medication is too expensive, please contact our office at [phone] option 4 or send us a message through MyChart.   We are unable to tell what your co-pay for medications will be in advance as this is different depending on your insurance coverage. However, we may be able to find a substitute medication at lower cost or fill out paperwork to get insurance to cover a needed   medication.   If a prior authorization is required to get your medication covered by your insurance company, please allow us 1-2 business days to complete this process.  Drug prices often vary depending on where the prescription is filled and some pharmacies may offer cheaper prices.  The website www.goodrx.com contains coupons for medications through different pharmacies. The prices here do not account for what the cost may be with help from insurance (it may be cheaper with your insurance), but the website can give you the  price if you did not use any insurance.  - You can print the associated coupon and take it with your prescription to the pharmacy.  - You may also stop by our office during regular business hours and pick up a GoodRx coupon card.  - If you need your prescription sent electronically to a different pharmacy, notify our office through [facility] MyChart or by phone at [phone] option 4.   

## 2021-06-06 ENCOUNTER — Encounter: Payer: Self-pay | Admitting: Dermatology

## 2021-06-08 ENCOUNTER — Telehealth: Payer: Self-pay

## 2021-06-08 NOTE — Telephone Encounter (Signed)
Left message on voicemail to return my call.  

## 2021-06-08 NOTE — Telephone Encounter (Signed)
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

## 2021-06-11 ENCOUNTER — Telehealth: Payer: Self-pay

## 2021-06-11 NOTE — Telephone Encounter (Signed)
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

## 2021-06-11 NOTE — Telephone Encounter (Signed)
Patient advised of BX results and scheduled for EDC. aw 

## 2021-06-27 DIAGNOSIS — E7800 Pure hypercholesterolemia, unspecified: Secondary | ICD-10-CM | POA: Diagnosis not present

## 2021-06-27 DIAGNOSIS — N182 Chronic kidney disease, stage 2 (mild): Secondary | ICD-10-CM | POA: Diagnosis not present

## 2021-06-27 DIAGNOSIS — I129 Hypertensive chronic kidney disease with stage 1 through stage 4 chronic kidney disease, or unspecified chronic kidney disease: Secondary | ICD-10-CM | POA: Diagnosis not present

## 2021-06-27 DIAGNOSIS — Z79899 Other long term (current) drug therapy: Secondary | ICD-10-CM | POA: Diagnosis not present

## 2021-06-27 DIAGNOSIS — Z125 Encounter for screening for malignant neoplasm of prostate: Secondary | ICD-10-CM | POA: Diagnosis not present

## 2021-06-27 DIAGNOSIS — E1122 Type 2 diabetes mellitus with diabetic chronic kidney disease: Secondary | ICD-10-CM | POA: Diagnosis not present

## 2021-06-28 DIAGNOSIS — Z79899 Other long term (current) drug therapy: Secondary | ICD-10-CM | POA: Diagnosis not present

## 2021-06-28 DIAGNOSIS — Z1331 Encounter for screening for depression: Secondary | ICD-10-CM | POA: Diagnosis not present

## 2021-06-28 DIAGNOSIS — Z0000 Encounter for general adult medical examination without abnormal findings: Secondary | ICD-10-CM | POA: Diagnosis not present

## 2021-07-19 ENCOUNTER — Ambulatory Visit: Payer: Medicare HMO | Admitting: Dermatology

## 2021-07-19 ENCOUNTER — Other Ambulatory Visit: Payer: Self-pay

## 2021-07-19 DIAGNOSIS — D045 Carcinoma in situ of skin of trunk: Secondary | ICD-10-CM

## 2021-07-19 DIAGNOSIS — L738 Other specified follicular disorders: Secondary | ICD-10-CM | POA: Diagnosis not present

## 2021-07-19 DIAGNOSIS — L820 Inflamed seborrheic keratosis: Secondary | ICD-10-CM | POA: Diagnosis not present

## 2021-07-19 DIAGNOSIS — L570 Actinic keratosis: Secondary | ICD-10-CM

## 2021-07-19 DIAGNOSIS — D099 Carcinoma in situ, unspecified: Secondary | ICD-10-CM

## 2021-07-19 DIAGNOSIS — L578 Other skin changes due to chronic exposure to nonionizing radiation: Secondary | ICD-10-CM

## 2021-07-19 NOTE — Patient Instructions (Signed)
Electrodesiccation and Curettage ("Scrape and Burn") Wound Care Instructions  Leave the original bandage on for 24 hours if possible.  If the bandage becomes soaked or soiled before that time, it is OK to remove it and examine the wound.  A small amount of post-operative bleeding is normal.  If excessive bleeding occurs, remove the bandage, place gauze over the site and apply continuous pressure (no peeking) over the area for 30 minutes. If this does not work, please call our clinic as soon as possible or page your doctor if it is after hours.   Once a day, cleanse the wound with soap and water. It is fine to shower. If a thick crust develops you may use a Q-tip dipped into dilute hydrogen peroxide (mix 1:1 with water) to dissolve it.  Hydrogen peroxide can slow the healing process, so use it only as needed.    After washing, apply petroleum jelly (Vaseline) or an antibiotic ointment if your doctor prescribed one for you, followed by a bandage.    For best healing, the wound should be covered with a layer of ointment at all times. If you are not able to keep the area covered with a bandage to hold the ointment in place, this may mean re-applying the ointment several times a day.  Continue this wound care until the wound has healed and is no longer open. It may take several weeks for the wound to heal and close.  Itching and mild discomfort is normal during the healing process.  If you have any discomfort, you can take Tylenol (acetaminophen) or ibuprofen as directed on the bottle. (Please do not take these if you have an allergy to them or cannot take them for another reason).  Some redness, tenderness and white or yellow material in the wound is normal healing.  If the area becomes very sore and red, or develops a thick yellow-green material (pus), it may be infected; please notify us.    Wound healing continues for up to one year following surgery. It is not unusual to experience pain in the scar  from time to time during the interval.  If the pain becomes severe or the scar thickens, you should notify the office.    A slight amount of redness in a scar is expected for the first six months.  After six months, the redness will fade and the scar will soften and fade.  The color difference becomes less noticeable with time.  If there are any problems, return for a post-op surgery check at your earliest convenience.  To improve the appearance of the scar, you can use silicone scar gel, cream, or sheets (such as Mederma or Serica) every night for up to one year. These are available over the counter (without a prescription).  Please call our office at [phone] for any questions or concerns.  If You Need Anything After Your Visit  If you have any questions or concerns for your doctor, please call our main line at [phone] and press option 4 to reach your doctor's medical assistant. If no one answers, please leave a voicemail as directed and we will return your call as soon as possible. Messages left after 4 pm will be answered the following business day.   You may also send [place] a message via [place]. We typically respond to MyChart messages within 1-2 business days.  For prescription refills, please ask your pharmacy to contact our office. Our fax number is [phone].  If you have an  urgent issue when the clinic is closed that cannot wait until the next business day, you can page your doctor at the number below.    Please note that while we do our best to be available for urgent issues outside of office hours, we are not available 24/7.   If you have an urgent issue and are unable to reach [place], you may choose to seek medical care at your doctor's office, retail clinic, urgent care center, or emergency room.  If you have a medical emergency, please immediately call 911 or go to the emergency department.  Pager Numbers  - Dr. Nehemiah Massed: [phone]  - Dr. Laurence Ferrari: [phone]  -  Dr. Nicole Kindred: [phone]  In the event of inclement weather, please call our main line at [phone] for an update on the status of any delays or closures.  Dermatology Medication Tips: Please keep the boxes that topical medications come in in order to help keep track of the instructions about where and how to use these. Pharmacies typically print the medication instructions only on the boxes and not directly on the medication tubes.   If your medication is too expensive, please contact our office at [phone] option 4 or send [place] a message through [place].   We are unable to tell what your co-pay for medications will be in advance as this is different depending on your insurance coverage. However, we may be able to find a substitute medication at lower cost or fill out paperwork to get insurance to cover a needed medication.   If a prior authorization is required to get your medication covered by your insurance company, please allow [place] 1-2 business days to complete this process.  Drug prices often vary depending on where the prescription is filled and some pharmacies may offer cheaper prices.  The website www.goodrx.com contains coupons for medications through different pharmacies. The prices here do not account for what the cost may be with help from insurance (it may be cheaper with your insurance), but the website can give you the price if you did not use any insurance.  - You can print the associated coupon and take it with your prescription to the pharmacy.  - You may also stop by our office during regular business hours and pick up a GoodRx coupon card.  - If you need your prescription sent electronically to a different pharmacy, notify our office through [facility] or by phone at [phone] option 4.     Si Usted Necesita Algo Despus de Su Visita  Tambin puede enviarnos un mensaje a travs de Pharmacist, community. Por lo general respondemos a los mensajes de MyChart en el  transcurso de 1 a 2 das hbiles.  Para renovar recetas, por favor pida a su farmacia que se ponga en contacto con nuestra oficina. Harland Dingwall de fax es [place] [phone].  Si tiene un asunto urgente cuando la clnica est cerrada y que no puede esperar hasta el siguiente da hbil, puede llamar/localizar a su doctor(a) al nmero que aparece a continuacin.   Por favor, tenga en cuenta que aunque hacemos todo lo posible para estar disponibles para asuntos urgentes fuera del horario de [place], no estamos disponibles las 24 horas del da, los 7 das de la [place].   Si tiene un problema urgente y no puede comunicarse con nosotros, puede optar por buscar atencin mdica  en el consultorio de su doctor(a), en una clnica privada, en un centro de atencin urgente o en una sala de emergencias.  Si tiene Engineering geologist, por favor llame inmediatamente al 911 o vaya a la sala de emergencias.  Nmeros de bper  - Dr. Nehemiah Massed: [phone]  - Dra. Moye: [phone]  - Dra. Nicole Kindred: [phone]  En caso de inclemencias del [place], por favor llame a Johnsie Kindred principal al [phone] para una actualizacin sobre el [place] de cualquier retraso o cierre.  Consejos para la medicacin en dermatologa: Por favor, guarde las cajas en las que vienen los medicamentos de uso tpico para ayudarle a seguir las instrucciones sobre dnde y cmo usarlos. Las farmacias generalmente imprimen las instrucciones del medicamento slo en las cajas y no directamente en los tubos del [place].   Si su medicamento es muy caro, por favor, pngase en contacto con Zigmund Daniel llamando al [phone] y presione la opcin 4 o envenos un mensaje a travs de Pharmacist, community.   No podemos decirle cul ser su copago por los medicamentos por adelantado ya que esto es diferente dependiendo de la cobertura de su seguro. Sin embargo, es posible que podamos encontrar un medicamento sustituto a Electrical engineer un  formulario para que el seguro cubra el medicamento que se considera necesario.   Si se requiere una autorizacin previa para que su compaa de seguros [place] su medicamento, por favor permtanos de 1 a 2 das hbiles para completar este proceso.  Los precios de los medicamentos varan con frecuencia dependiendo del Environmental consultant de dnde se surte la receta y alguna farmacias pueden ofrecer precios ms baratos.  El sitio web www.goodrx.com tiene cupones para medicamentos de Airline pilot. Los precios aqu no tienen en cuenta lo que podra costar con la ayuda del seguro (puede ser ms barato con su seguro), pero el sitio web puede darle el precio si no utiliz Research scientist (physical sciences).  - Puede imprimir el cupn correspondiente y llevarlo con su receta a la farmacia.  - Tambin puede pasar por nuestra oficina durante el horario de atencin regular y Charity fundraiser una tarjeta de cupones de GoodRx.  - Si necesita que su receta se enve electrnicamente a una farmacia diferente, informe a nuestra oficina a travs de MyChart de [place] o por telfono llamando al [phone] y presione la opcin 4.

## 2021-07-19 NOTE — Progress Notes (Signed)
Follow-Up Visit   Subjective  Jackson Parks is a 80 y.o. 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 /The patient has spots, moles and lesions to be evaluated, some may be new or changing and the patient has concerns that these could be cancer./).  The following portions of the chart were reviewed this encounter and updated as appropriate:  Tobacco  Allergies  Meds  Problems  Med Hx  Surg Hx  Fam Hx     Review of Systems: No other skin or systemic complaints except as noted in HPI or Assessment and Plan.   Objective  Well appearing patient in no apparent distress; mood and affect are within normal limits.  A focused examination was performed including left mid infra clavicular, chest, arms. Relevant physical exam findings are noted in the Assessment and Plan.  left mid infra clavicular Healing scar   left chest x2 (2) Erythematous keratotic or waxy stuck-on papule or plaque.   bilateral arms x 24 (24) Erythematous thin papules/macules with gritty scale.    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 Destruction of lesion Complexity: extensive   Destruction method: electrodesiccation and curettage   Informed consent: discussed and consent obtained   Timeout:  patient name, date of birth, surgical site, and procedure verified Procedure prep:  Patient was prepped and draped in usual sterile fashion Prep type:  Isopropyl alcohol Anesthesia: the lesion was anesthetized in a standard fashion   Anesthetic:  1% lidocaine w/ epinephrine 1-100,000 buffered w/ 8.4% NaHCO3 Curettage performed in three different directions: Yes   Electrodesiccation performed over the curetted area: Yes   Final wound size (cm):  3.1 Hemostasis achieved with:  pressure, aluminum chloride and electrodesiccation Outcome:  patient tolerated procedure well with no complications   Post-procedure details: sterile dressing applied and wound care instructions given   Dressing type: bandage and petrolatum    Inflamed seborrheic keratosis left chest x2 Destruction of lesion - left chest x2 Complexity: simple   Destruction method: cryotherapy   Informed consent: discussed and consent obtained   Timeout:  patient name, date of birth, surgical site, and procedure verified Lesion destroyed using liquid nitrogen: Yes   Region frozen until ice ball extended beyond lesion: Yes   Outcome: patient tolerated procedure well with no complications   Post-procedure details: wound care instructions given    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 They are chronic with expected duration over 1 year. A portion of actinic keratoses will progress to squamous cell carcinoma of the skin. It is not possible to reliably predict which spots will progress to skin cancer and so treatment is recommended to prevent development of skin cancer.  Recommend daily broad spectrum sunscreen SPF 30+ to sun-exposed areas, reapply every 2 hours as needed.  Recommend staying in the shade or wearing long sleeves, sun glasses (UVA+UVB protection) and wide brim hats (4-inch brim around the entire circumference of the hat). Call for new or changing lesions.  Destruction of lesion - bilateral arms x 24 Complexity: simple   Destruction method: cryotherapy   Informed consent: discussed and consent obtained   Timeout:  patient name, date of birth, surgical site, and procedure verified Lesion destroyed using liquid nitrogen: Yes   Region frozen until  ice ball extended beyond lesion: Yes   Outcome: patient tolerated procedure well with no complications   Post-procedure details: wound care instructions given   Additional details:   Prior to procedure, discussed risks of blister formation, small wound, skin dyspigmentation, or rare scar following cryotherapy. Recommend Vaseline ointment to treated areas while healing.  Sebaceous Hyperplasia - Small yellow papules with a central dell Benign-appearing.  Observation.  Call clinic for new or changing lesions.  Recommend daily use of broad spectrum spf 30+ sunscreen to sun-exposed areas.   Actinic Damage - chronic, secondary to cumulative UV radiation exposure/sun exposure over time - diffuse scaly erythematous macules with underlying dyspigmentation - Recommend daily broad spectrum sunscreen SPF 30+ to sun-exposed areas, reapply every 2 hours as needed.  - Recommend staying in the shade or wearing long sleeves, sun glasses (UVA+UVB protection) and wide brim hats (4-inch brim around the entire circumference of the hat). - Call for new or changing lesions.  Return for keep appt as schedule 12/05/21. IRuthell Rummage, CMA, am acting as scribe for Sarina Ser, MD. Documentation: I have reviewed the above documentation for accuracy and completeness, and I agree with the above.  Sarina Ser, MD

## 2021-07-28 ENCOUNTER — Encounter: Payer: Self-pay | Admitting: Dermatology

## 2021-07-30 DIAGNOSIS — M170 Bilateral primary osteoarthritis of knee: Secondary | ICD-10-CM | POA: Diagnosis not present

## 2021-10-09 DIAGNOSIS — H40013 Open angle with borderline findings, low risk, bilateral: Secondary | ICD-10-CM | POA: Diagnosis not present

## 2021-10-09 DIAGNOSIS — H02889 Meibomian gland dysfunction of unspecified eye, unspecified eyelid: Secondary | ICD-10-CM | POA: Diagnosis not present

## 2021-10-09 DIAGNOSIS — H1045 Other chronic allergic conjunctivitis: Secondary | ICD-10-CM | POA: Diagnosis not present

## 2021-10-09 DIAGNOSIS — H02839 Dermatochalasis of unspecified eye, unspecified eyelid: Secondary | ICD-10-CM | POA: Diagnosis not present

## 2021-10-09 DIAGNOSIS — E119 Type 2 diabetes mellitus without complications: Secondary | ICD-10-CM | POA: Diagnosis not present

## 2021-10-10 ENCOUNTER — Ambulatory Visit: Payer: Medicare HMO | Admitting: Internal Medicine

## 2021-10-15 ENCOUNTER — Telehealth: Payer: Self-pay

## 2021-10-15 DIAGNOSIS — M170 Bilateral primary osteoarthritis of knee: Secondary | ICD-10-CM | POA: Diagnosis not present

## 2021-10-15 NOTE — Telephone Encounter (Signed)
? ?  Pre-operative Risk Assessment  ?  ?Patient Name: Jackson Parks  ?DOB: 12-21-1940 ?MRN: [id number]  ? ?  ? ?Request for Surgical Clearance   ? ?Procedure:   Right TKA Traditional knee  ? ?Date of Surgery:  Clearance 11/14/21                              ?   ?Surgeon: [name] ?Surgeon's Group or Practice Name:  [facility]  ?Phone number:  [phone] ?Fax number:  [phone] ?  ?Type of Clearance Requested:   ?Medical and Pharmacy ?ASA duration not specified ? ?  ?Type of Anesthesia:  Spinal ?  ?Additional requests/questions:   ? ?

## 2021-10-15 NOTE — Telephone Encounter (Signed)
Patient has not been seen in > 6 months. He has an upcoming appointment with Caron Presume, PA on 11/01/21 for surgical clearance. Will route to provider and clear from preop pool.  ?

## 2021-10-31 ENCOUNTER — Ambulatory Visit: Payer: Medicare HMO | Admitting: Physician Assistant

## 2021-10-31 NOTE — Progress Notes (Signed)
?Cardiology Office Note:   ? ?Date:  11/01/2021  ? ?ID:  Jackson Parks, DOB [birth year], MRN [id number] ? ?PCP:  [name], MD ?[place] Cardiologist: [name], MD  ? ?Reason for visit: Preop clearance ?Procedure:   Right TKA Traditional knee on 11/14/21                              ?Surgeon: [name] with [facility]  ?Fax number:  [phone] ? ?History of Present Illness:   ? ?Jackson Parks is a 81 y.o. male with a hx of atrial flutter status post ablation with Dr. [name] in 2014, CAD status post PCI to diagonal branch 2006, sleep apnea on CPAP, hyperlipidemia, DM, obesity.  LHC in February 2014 showed continued patency of the stent and nonobstructive disease.  He quit smoking in 2002. ? ?Last saw Dr. Martinique in March 2022 and was doing well without angina and noted weight loss of 30 pounds. ? ?Today, he states he is doing well from a heart standpoint.  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  He is scheduled for a right knee replacement on April 5 and then they will reassess for needs for left knee surgery.   ? ?He has regained weight secondary to dietary indiscretion.  He likes to eat out.  He is able to rake the leaves for 30 minutes before he needs to sit down secondary to fatigue.  He denies chest pain, shortness of breath, PND, orthopnea, lower extremity edema and syncope.  He has had no palpitations. ? ?Currently taking Crestor 5 mg 3 times a week.  He was not able to tolerate higher doses of Crestor and other statins secondary to myalgias.  LDL 144 in 06/2021. ? ?  ?Past Medical History:  ?Diagnosis Date  ? Actinic keratosis 01/19/2007  ? L clavicle  ? Atrial flutter ([place]) 10/05/2012  ? Basal cell carcinoma 01/19/2007  ? L ant shoulder  ? BCC (basal cell carcinoma of skin) 05/16/2021  ? L ant shoulder - excised 05/29/21  ? CAD (coronary artery disease)   ? COPD (chronic obstructive pulmonary disease) ([place])   ? Diabetes mellitus without  complication ([place])   ? DM (dermatomyositis)   ? Hypercholesterolemia   ? Obesity   ? Squamous cell carcinoma of skin 09/19/2008  ? R post lat shoulder SCCIS  ? Squamous cell carcinoma of skin 09/19/2008  ? R med upper back SCCIS  ? Squamous cell carcinoma of skin 06/05/2021  ? L mid infra clavicular - SCCIS arising in [place] - needs treatment  ? ? ?Past Surgical History:  ?Procedure Laterality Date  ? A FLUTTER ABLATION N/A 11/26/2012  ? Procedure: ABLATION A FLUTTER;  Surgeon: [name], MD;  [facility] C[facility] LAB;  Service: Cardiovascular;  Laterality: N/A;  ? CARDIOVERSION N/A 10/12/2012  ? Procedure: CARDIOVERSION;  Surgeon: [name], MD;  Location: [place];  Service: Cardiovascular;  Laterality: N/A;  ? LEFT HEART CATHETERIZATION WITH CORONARY ANGIOGRAM N/A 10/05/2012  ? Procedure: LEFT HEART CATHETERIZATION WITH CORONARY ANGIOGRAM;  Surgeon: Peter M Martinique, MD;  Location: [facility] CATH LAB;  Service: Cardiovascular;  Laterality: N/A;  ? PLANTAR FASCIECTOMY  2005  ? skin lesions    ? multiple removed  ? steting    ? stenting of the diagonal branch using a 2.5 x 23 Cypher drug-eluting stent followed by post dilatation  ? TEE  WITHOUT CARDIOVERSION N/A 10/12/2012  ? Procedure: TRANSESOPHAGEAL ECHOCARDIOGRAM (TEE);  Surgeon: [name], MD;  Location: [place];  Service: Cardiovascular;  Laterality: N/A;  ? ? ?Current Medications: ?Current Meds  ?Medication Sig  ? acetaminophen (TYLENOL) 500 MG tablet Take 500 mg by mouth every 6 (six) hours as needed.  ? aspirin EC 81 MG tablet Take 81 mg by mouth daily.  ? Co-Enzyme Q-10 100 MG CAPS Take 50 mg by mouth daily.  ? glimepiride (AMARYL) 2 MG tablet Take 2 mg by mouth daily before breakfast.  ? lisinopril (PRINIVIL,ZESTRIL) 10 MG tablet TAKE 1 TABLET BY MOUTH  EVERY EVENING  ? mupirocin ointment (BACTROBAN) 2 % Apply 1 application topically daily. With dressing changes  ? NONFORMULARY OR COMPOUNDED ITEM Apply 1-2 g topically 4 (four) times daily.  ?  rosuvastatin (CRESTOR) 5 MG tablet Take 1 tablet (5 mg total) by mouth daily.  ? sitaGLIPtan-metformin (JANUMET) 50-1000 MG per tablet Take 1 tablet by mouth 2 (two) times daily with a meal.  ? traZODone (DESYREL) 100 MG tablet Take 1 tablet by mouth at bedtime.  ? ?Current Facility-Administered Medications for the 11/01/21 encounter (Office Visit) with [name], PA-C  ?Medication  ? betamethasone acetate-betamethasone sodium phosphate (CELESTONE) injection 3 mg  ?  ? ?Allergies:   Nsaids, Statins, and Tolmetin  ? ?Social History  ? ?Socioeconomic History  ? Marital status: Married  ?  Spouse name: Not on file  ? Number of children: 2  ? Years of education: Not on file  ? Highest education level: Not on file  ?Occupational History  ? Occupation: truck driver-RETIRED  ?Tobacco Use  ? Smoking status: Former  ?  Packs/day: 3.00  ?  Years: 39.00  ?  Pack years: 117.00  ?  Types: Cigarettes  ?  Quit date: 12/23/2000  ?  Years since quitting: 20.8  ? Smokeless tobacco: Never  ?Vaping Use  ? Vaping Use: Never used  ?Substance and Sexual Activity  ? Alcohol use: Yes  ?  Comment: two margaritas per year  ? Drug use: No  ? Sexual activity: Not on file  ?Other Topics Concern  ? Not on file  ?Social History Narrative  ? Not on file  ? ?Social Determinants of Health  ? ?Financial Resource Strain: Not on file  ?Food Insecurity: Not on file  ?Transportation Needs: Not on file  ?Physical Activity: Not on file  ?Stress: Not on file  ?Social Connections: Not on file  ?  ? ?Family History: ?The patient's family history includes Coronary artery disease (age of onset: 63) in his brother; Heart attack (age of onset: 54) in his father; Heart failure (age of onset: 1) in his mother; Peripheral vascular disease (age of onset: 60) in his brother. ? ?ROS:   ?Please see the history of present illness.    ? ?EKGs/Labs/Other Studies Reviewed:   ? ?EKG:  The ekg ordered today demonstrates sinus rhythm with first-degree AV block, right  bundle branch block, heart rate 75.  Unchanged from prior. ? ?Recent Labs: ?No results found for requested labs within last 8760 hours.  ? ?Recent Lipid Panel ?Lab Results  ?Component Value Date/Time  ? CHOL 190 10/04/2012 05:35 AM  ? TRIG 180 (H) 10/04/2012 05:35 AM  ? HDL 51 10/04/2012 05:35 AM  ? LDLCALC 103 (H) 10/04/2012 05:35 AM  ? ? ?Physical Exam:   ? ?VS:  BP 132/74   Pulse 75   Ht '5\' 11"'$  (1.803 m)  Wt 287 lb 9.6 oz (130.5 kg)   SpO2 92%   BMI 40.11 kg/m?    ?No data found. ? ?Wt Readings from Last 3 Encounters:  ?11/01/21 287 lb 9.6 oz (130.5 kg)  ?11/01/21 287 lb (130.2 kg)  ?10/25/20 273 lb 6.4 oz (124 kg)  ?  ? ?GEN: Well nourished, well developed in no acute distress; obese ?HEENT: Normal ?NECK: No JVD; No carotid bruits ?CARDIAC: RRR, systolic murmur ?RESPIRATORY:  Clear to auscultation without rales, wheezing or rhonchi  ?ABDOMEN: Soft, non-tender, non-distended ?MUSCULOSKELETAL: No edema; No deformity  ?SKIN: Warm and dry ?NEUROLOGIC:  Alert and oriented ?PSYCHIATRIC:  Normal affect  ? ?  ?ASSESSMENT AND PLAN  ? ?Preop clearance for R [id number]} ?Mr. Raimer's perioperative risk of a major cardiac event is 0.9% according to the Revised Cardiac Risk Index (RCRI).  Therefore, he is at low risk for perioperative complications.   His functional capacity is good at 5.62 METs according to the Duke Activity Status Index (DASI). ?Recommendations: ?According to ACC/AHA guidelines, no further cardiovascular testing needed.  The patient may proceed to surgery at acceptable risk.   ?Antiplatelet and/or Anticoagulation Recommendations: ?Given the patient's known coronary artery disease, we prefer to continue ASA throughout the perioperative period. However, if doing so will significantly increase morbidity or mortality, may hold for 5-7 days and restart as soon after when deemed safe per surgeon. ? ?Atrial flutter, normal sinus rhythm today ?-Status post ablation 2014 without recurrence ?-Maintaining  normal sinus. ? ?Coronary artery disease, no angina ?-Status post PCI to diagonal in 2006 ?-Continue statin and aspirin therapy. ? ?Hypertension, well controlled ?-Continue lisinopril. ?-Goal BP is <130/80.  Recommend

## 2021-10-31 NOTE — Progress Notes (Signed)
HPI ?male former smoker followed here for OSA/CPAP and for mild COPD, complicated by DM 2, CAD/stent, a flutter/ablation ?PFT-12/21/15-minimal obstructive airways disease-FVC 3.82/95%, FEV1 3.23/110%, ratio 0.85, TLC 88%, DLCO 77% ?N[facility]- [facility]- AHI 31.8/ hr,  desaturation to 81%, body weight 307 lbs ? ?------------------------------------------------------------------------------------- ? ? ? ?09/06/20- 81 year old male former smoker followed here for OSA/CPAP and for mild COPD, complicated by DM 2, CAD/stent, a flutter/ablation ?CPAP 15/ [facility] ?Download- compliance 99%, AHI 0.6/ hr   New machine 2016 ?Body weight today- 270 lbs ?Covid vax- 2 Phizer ?Flu vax-declines ?-----Patient is feeling good, sleeping good, machine working good ?Doing well with full-face mask and says he can't sleep without his CPAP. Download reviewed. ?Dieting to lose weight. ?No acute issues. Breathing is stable. No listed inhalers. ?CXR 09/08/19- ?IMPRESSION: ?No acute findings. ? ?11/01/21- 81 year old male former smoker followed here for OSA/CPAP and for mild COPD, complicated by DM 2, CAD/stent, A Flutter/ablation ?-no inhalers, trazodone 100,  ?CPAP 15/ [facility] New machine 2016               ?Download-    ?Body weight today- 287 lbs ?Covid vax- 2 Phizer ?Flu vax-declines ?-----Patient is doing good, no concerns. Having knee replacement April 5th. ?Pending TKR with no special problems anticipated. ?He is comfortable with his CPAP.  We discussed old machine appropriate for replacement with change to AutoPap. ? ?ROS-see HPI   + = positive ?Constitutional:    +diet loss, night sweats, fevers, chills, fatigue, lassitude. ?HEENT:    headaches, difficulty swallowing, tooth/dental problems, sore throat,  ?     sneezing, itching, ear ache, +nasal congestion, post nasal drip, snoring ?CV:    chest pain, orthopnea, PND, swelling in lower extremities, anasarca,                            ?  dizziness, palpitations ?Resp:    shortness of breath with exertion or at rest.   ?             productive cough,   non-productive cough, coughing up of blood.   ?           change in color of mucus.  wheezing.   ?Skin:    rash or lesions. ?GI:  No-   heartburn, indigestion, abdominal pain, nausea, vomiting, diarrhea,  ?               change in bowel habits, loss of appetite ?GU: dysuria, change in color of urine, no urgency or frequency.   flank pain. ?MS:   +joint pain, stiffness, decreased range of motion, back pain. ?Neuro-     nothing unusual ?Psych:  change in mood or affect.  depression or anxiety.   memory loss. ? ?OBJ- Physical Exam ?General- Alert, Oriented, Affect-appropriate, Distress- none acute, + obese ?Skin- rash-none, lesions- none, excoriation- none ?Lymphadenopathy- none ?Head- atraumatic ?           Eyes- Gross vision intact, PERRLA, conjunctivae and secretions clear ?           Ears- Hearing, canals-normal ?           Nose- no-Septal dev, mucus, polyps, erosion, perforation  ?           Throat- Mallampati IV, mucosa clear , drainage- none, tonsils- atrophic, has lower teeth, otherwise edentulous ?Neck- flexible , trachea midline, no stridor , thyroid nl, carotid no bruit ?Chest - symmetrical excursion , unlabored ?  Heart/CV- RRR , no murmur , no gallop  , no rub, nl s1 s2 ?                          JVD- none , edema- none, stasis changes- none, varices- none ?          Lung- + clear, wheeze- none, cough- none , dullness-none, rub- none ?          Chest wall-  ?Abd-  ?Br/ Gen/ Rectal- Not done, not indicated ?Extrem- cyanosis- none, clubbing, none, atrophy- none, strength- nl ?Neuro- grossly intact to observation ? ? ? ? ? ?

## 2021-11-01 ENCOUNTER — Encounter: Payer: Self-pay | Admitting: Internal Medicine

## 2021-11-01 ENCOUNTER — Other Ambulatory Visit: Payer: Self-pay

## 2021-11-01 ENCOUNTER — Ambulatory Visit: Payer: Medicare HMO | Admitting: Internal Medicine

## 2021-11-01 ENCOUNTER — Encounter: Payer: Self-pay | Admitting: Physician Assistant

## 2021-11-01 ENCOUNTER — Ambulatory Visit: Payer: Medicare HMO | Admitting: Physician Assistant

## 2021-11-01 VITALS — BP 132/74 | HR 75 | Ht 71.0 in | Wt 287.6 lb

## 2021-11-01 DIAGNOSIS — I2510 Atherosclerotic heart disease of native coronary artery without angina pectoris: Secondary | ICD-10-CM | POA: Diagnosis not present

## 2021-11-01 DIAGNOSIS — E7800 Pure hypercholesterolemia, unspecified: Secondary | ICD-10-CM

## 2021-11-01 DIAGNOSIS — G4733 Obstructive sleep apnea (adult) (pediatric): Secondary | ICD-10-CM

## 2021-11-01 DIAGNOSIS — J449 Chronic obstructive pulmonary disease, unspecified: Secondary | ICD-10-CM | POA: Diagnosis not present

## 2021-11-01 DIAGNOSIS — I483 Typical atrial flutter: Secondary | ICD-10-CM

## 2021-11-01 DIAGNOSIS — Z01818 Encounter for other preprocedural examination: Secondary | ICD-10-CM

## 2021-11-01 NOTE — Patient Instructions (Signed)
Medication Instructions:  ?No changes ?*If you need a refill on your cardiac medications before your next appointment, please call your pharmacy* ? ? ?Lab Work: ?No labs ?If you have labs (blood work) drawn today and your tests are completely normal, you will receive your results only by: ?MyChart Message (if you have MyChart) OR ?A paper copy in the mail ?If you have any lab test that is abnormal or we need to change your treatment, we will call you to review the results. ? ? ?Testing/Procedures: ?No Testing ? ? ?Follow-Up: ?At [facility], you and your health needs are our priority.  As part of our continuing mission to provide you with exceptional heart care, we have created designated Provider Care Teams.  These Care Teams include your primary Cardiologist (physician) and Advanced Practice Providers (APPs -  Physician Assistants and Nurse Practitioners) who all work together to provide you with the care you need, when you need it. ? ?We recommend signing up for the patient portal called "MyChart".  Sign up information is provided on this After Visit Summary.  MyChart is used to connect with patients for Virtual Visits (Telemedicine).  Patients are able to view lab/test results, encounter notes, upcoming appointments, etc.  Non-urgent messages can be sent to your provider as well.   ?To learn more about what you can do with MyChart, go to [email or web address].   ? ?Your next appointment:   ?1 year(s) ? ?The format for your next appointment:   ?In Person ? ?Provider:   ?[name], MD   ? ? ?  ?

## 2021-11-01 NOTE — Assessment & Plan Note (Signed)
Benefits from CPAP with no special concerns. ?Plan-replace old machine, changing to AutoPap 10-20 and continuing mask of choice, humidifier, supplies,  Airview/card ?

## 2021-11-01 NOTE — Addendum Note (Signed)
Addended by: Elby Beck R on: 11/01/2021 09:50 AM ? ? Modules accepted: Orders ? ?

## 2021-11-01 NOTE — Patient Instructions (Signed)
Order- DME [facility]- please replace old CPAP machine, change to auto 10-20, mask of choice, humidifier, supplies, AirView/ card ? ?Good luck with your knee surgery. Please call if we can help with anything.  ?

## 2021-11-01 NOTE — Assessment & Plan Note (Signed)
Encouraged more effort at weight loss.  This may get easier after her knees are replaced. ?

## 2021-11-09 DIAGNOSIS — Z01818 Encounter for other preprocedural examination: Secondary | ICD-10-CM | POA: Diagnosis not present

## 2021-11-09 DIAGNOSIS — M1711 Unilateral primary osteoarthritis, right knee: Secondary | ICD-10-CM | POA: Diagnosis not present

## 2021-11-14 DIAGNOSIS — G4700 Insomnia, unspecified: Secondary | ICD-10-CM | POA: Diagnosis not present

## 2021-11-14 DIAGNOSIS — E669 Obesity, unspecified: Secondary | ICD-10-CM | POA: Diagnosis not present

## 2021-11-14 DIAGNOSIS — G8918 Other acute postprocedural pain: Secondary | ICD-10-CM | POA: Diagnosis not present

## 2021-11-14 DIAGNOSIS — Z7982 Long term (current) use of aspirin: Secondary | ICD-10-CM | POA: Diagnosis not present

## 2021-11-14 DIAGNOSIS — Z7984 Long term (current) use of oral hypoglycemic drugs: Secondary | ICD-10-CM | POA: Diagnosis not present

## 2021-11-14 DIAGNOSIS — E119 Type 2 diabetes mellitus without complications: Secondary | ICD-10-CM | POA: Diagnosis not present

## 2021-11-14 DIAGNOSIS — I2510 Atherosclerotic heart disease of native coronary artery without angina pectoris: Secondary | ICD-10-CM | POA: Diagnosis not present

## 2021-11-14 DIAGNOSIS — I10 Essential (primary) hypertension: Secondary | ICD-10-CM | POA: Diagnosis not present

## 2021-11-14 DIAGNOSIS — M1711 Unilateral primary osteoarthritis, right knee: Secondary | ICD-10-CM | POA: Diagnosis not present

## 2021-11-14 DIAGNOSIS — M25561 Pain in right knee: Secondary | ICD-10-CM | POA: Diagnosis not present

## 2021-11-14 DIAGNOSIS — I4819 Other persistent atrial fibrillation: Secondary | ICD-10-CM | POA: Diagnosis not present

## 2021-11-15 DIAGNOSIS — Z7982 Long term (current) use of aspirin: Secondary | ICD-10-CM | POA: Diagnosis not present

## 2021-11-15 DIAGNOSIS — M1711 Unilateral primary osteoarthritis, right knee: Secondary | ICD-10-CM | POA: Diagnosis not present

## 2021-11-15 DIAGNOSIS — E119 Type 2 diabetes mellitus without complications: Secondary | ICD-10-CM | POA: Diagnosis not present

## 2021-11-15 DIAGNOSIS — I4819 Other persistent atrial fibrillation: Secondary | ICD-10-CM | POA: Diagnosis not present

## 2021-11-15 DIAGNOSIS — Z7984 Long term (current) use of oral hypoglycemic drugs: Secondary | ICD-10-CM | POA: Diagnosis not present

## 2021-11-15 DIAGNOSIS — I10 Essential (primary) hypertension: Secondary | ICD-10-CM | POA: Diagnosis not present

## 2021-11-15 DIAGNOSIS — E669 Obesity, unspecified: Secondary | ICD-10-CM | POA: Diagnosis not present

## 2021-11-27 DIAGNOSIS — Z96659 Presence of unspecified artificial knee joint: Secondary | ICD-10-CM | POA: Insufficient documentation

## 2021-12-03 DIAGNOSIS — Z96651 Presence of right artificial knee joint: Secondary | ICD-10-CM | POA: Diagnosis not present

## 2021-12-05 ENCOUNTER — Ambulatory Visit: Payer: Medicare HMO | Admitting: Dermatology

## 2021-12-05 DIAGNOSIS — L578 Other skin changes due to chronic exposure to nonionizing radiation: Secondary | ICD-10-CM | POA: Diagnosis not present

## 2021-12-05 DIAGNOSIS — Z85828 Personal history of other malignant neoplasm of skin: Secondary | ICD-10-CM | POA: Diagnosis not present

## 2021-12-05 DIAGNOSIS — L820 Inflamed seborrheic keratosis: Secondary | ICD-10-CM | POA: Diagnosis not present

## 2021-12-05 DIAGNOSIS — D229 Melanocytic nevi, unspecified: Secondary | ICD-10-CM

## 2021-12-05 DIAGNOSIS — L570 Actinic keratosis: Secondary | ICD-10-CM

## 2021-12-05 DIAGNOSIS — L821 Other seborrheic keratosis: Secondary | ICD-10-CM

## 2021-12-05 DIAGNOSIS — Z1283 Encounter for screening for malignant neoplasm of skin: Secondary | ICD-10-CM

## 2021-12-05 DIAGNOSIS — D099 Carcinoma in situ, unspecified: Secondary | ICD-10-CM

## 2021-12-05 DIAGNOSIS — D1800 Hemangioma unspecified site: Secondary | ICD-10-CM

## 2021-12-05 DIAGNOSIS — D045 Carcinoma in situ of skin of trunk: Secondary | ICD-10-CM

## 2021-12-05 DIAGNOSIS — L814 Other melanin hyperpigmentation: Secondary | ICD-10-CM | POA: Diagnosis not present

## 2021-12-05 NOTE — Progress Notes (Signed)
? ?Follow-Up Visit ?  ?Subjective  ?Jackson Parks is a 81 y.o. male who presents for the following: Annual Exam (History of BCC and SCC - The patient presents for Total-Body Skin Exam (TBSE) for skin cancer screening and mole check.  The patient has spots, moles and lesions to be evaluated, some may be new or changing and the patient has concerns that these could be cancer./). ? ?The following portions of the chart were reviewed this encounter and updated as appropriate:  ? Tobacco  Allergies  Meds  Problems  Med Hx  Surg Hx  Fam Hx   ?  ?Review of Systems:  No other skin or systemic complaints except as noted in HPI or Assessment and Plan. ? ?Objective  ?Well appearing patient in no apparent distress; mood and affect are within normal limits. ? ?A full examination was performed including scalp, head, eyes, ears, nose, lips, neck, chest, axillae, abdomen, back, buttocks, bilateral upper extremities, bilateral lower extremities, hands, feet, fingers, toes, fingernails, and toenails. All findings within normal limits unless otherwise noted below. ? ?Face, scalp, hands (20) ?Erythematous thin papules/macules with gritty scale.  ?Hyperkeratotic papule of right of midline underside chin ? ?Trunk, arms (30) ?Erythematous stuck-on, waxy papule or plaque ? ?Left mid infraclavicular ?1.0 cm pink patch ? ? ?Assessment & Plan  ? ?History of Basal Cell Carcinoma of the Skin ?- No evidence of recurrence today ?- Recommend regular full body skin exams ?- Recommend daily broad spectrum sunscreen SPF 30+ to sun-exposed areas, reapply every 2 hours as needed.  ?- Call if any new or changing lesions are noted between office visits ? ?History of Squamous Cell Carcinoma of the Skin ?- No evidence of recurrence today ?- No lymphadenopathy ?- Recommend regular full body skin exams ?- Recommend daily broad spectrum sunscreen SPF 30+ to sun-exposed areas, reapply every 2 hours as needed.  ?- Call if any new or changing lesions  are noted between office visits ? ?Lentigines ?- Scattered tan macules ?- Due to sun exposure ?- Benign-appearing, observe ?- Recommend daily broad spectrum sunscreen SPF 30+ to sun-exposed areas, reapply every 2 hours as needed. ?- Call for any changes ? ?Seborrheic Keratoses ?- Stuck-on, waxy, tan-brown papules and/or plaques  ?- Benign-appearing ?- Discussed benign etiology and prognosis. ?- Observe ?- Call for any changes ? ?Melanocytic Nevi ?- Tan-brown and/or pink-flesh-colored symmetric macules and papules ?- Benign appearing on exam today ?- Observation ?- Call clinic for new or changing moles ?- Recommend daily use of broad spectrum spf 30+ sunscreen to sun-exposed areas.  ? ?Hemangiomas ?- Red papules ?- Discussed benign nature ?- Observe ?- Call for any changes ? ?Actinic Damage ?- Chronic condition, secondary to cumulative UV/sun exposure ?- diffuse scaly erythematous macules with underlying dyspigmentation ?- Recommend daily broad spectrum sunscreen SPF 30+ to sun-exposed areas, reapply every 2 hours as needed.  ?- Staying in the shade or wearing long sleeves, sun glasses (UVA+UVB protection) and wide brim hats (4-inch brim around the entire circumference of the hat) are also recommended for sun protection.  ?- Call for new or changing lesions. ? ?Skin cancer screening performed today. ? ?AK (actinic keratosis) (20) ?Face, scalp, hands ?Destruction of lesion - Face, scalp, hands ?Complexity: simple   ?Destruction method: cryotherapy   ?Informed consent: discussed and consent obtained   ?Timeout:  patient name, date of birth, surgical site, and procedure verified ?Lesion destroyed using liquid nitrogen: Yes   ?Region frozen until ice ball extended beyond lesion: Yes   ?  Outcome: patient tolerated procedure well with no complications   ?Post-procedure details: wound care instructions given   ? ?Inflamed seborrheic keratosis (30) ?Trunk, arms ?Destruction of lesion - Trunk, arms ?Complexity: simple    ?Destruction method: cryotherapy   ?Informed consent: discussed and consent obtained   ?Timeout:  patient name, date of birth, surgical site, and procedure verified ?Lesion destroyed using liquid nitrogen: Yes   ?Region frozen until ice ball extended beyond lesion: Yes   ?Outcome: patient tolerated procedure well with no complications   ?Post-procedure details: wound care instructions given   ? 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 ?Residual SCC in situ ? ?Skin cancer screening ? ?Return in about 3 months (around 03/06/2022) for AK follow up. ? ?I, Ashok Cordia, CMA, am acting as scribe for Sarina Ser, MD . ?Documentation: I have reviewed the above documentation for accuracy and completeness, and I agree with the above. ? ?Sarina Ser, MD ? ?

## 2021-12-05 NOTE — Patient Instructions (Signed)

## 2021-12-06 DIAGNOSIS — Z96651 Presence of right artificial knee joint: Secondary | ICD-10-CM | POA: Diagnosis not present

## 2021-12-11 DIAGNOSIS — Z96651 Presence of right artificial knee joint: Secondary | ICD-10-CM | POA: Diagnosis not present

## 2021-12-12 ENCOUNTER — Encounter: Payer: Self-pay | Admitting: Dermatology

## 2021-12-13 DIAGNOSIS — Z96651 Presence of right artificial knee joint: Secondary | ICD-10-CM | POA: Diagnosis not present

## 2021-12-19 DIAGNOSIS — Z96651 Presence of right artificial knee joint: Secondary | ICD-10-CM | POA: Diagnosis not present

## 2021-12-20 DIAGNOSIS — N1831 Chronic kidney disease, stage 3a: Secondary | ICD-10-CM | POA: Diagnosis not present

## 2021-12-20 DIAGNOSIS — E7800 Pure hypercholesterolemia, unspecified: Secondary | ICD-10-CM | POA: Diagnosis not present

## 2021-12-20 DIAGNOSIS — Z79899 Other long term (current) drug therapy: Secondary | ICD-10-CM | POA: Diagnosis not present

## 2021-12-20 DIAGNOSIS — E1122 Type 2 diabetes mellitus with diabetic chronic kidney disease: Secondary | ICD-10-CM | POA: Diagnosis not present

## 2021-12-21 DIAGNOSIS — Z96651 Presence of right artificial knee joint: Secondary | ICD-10-CM | POA: Diagnosis not present

## 2021-12-21 DIAGNOSIS — R600 Localized edema: Secondary | ICD-10-CM | POA: Diagnosis not present

## 2021-12-26 DIAGNOSIS — Z96651 Presence of right artificial knee joint: Secondary | ICD-10-CM | POA: Diagnosis not present

## 2021-12-27 DIAGNOSIS — Z9989 Dependence on other enabling machines and devices: Secondary | ICD-10-CM | POA: Diagnosis not present

## 2021-12-27 DIAGNOSIS — I2510 Atherosclerotic heart disease of native coronary artery without angina pectoris: Secondary | ICD-10-CM | POA: Diagnosis not present

## 2021-12-27 DIAGNOSIS — Z125 Encounter for screening for malignant neoplasm of prostate: Secondary | ICD-10-CM | POA: Diagnosis not present

## 2021-12-27 DIAGNOSIS — I10 Essential (primary) hypertension: Secondary | ICD-10-CM | POA: Diagnosis not present

## 2021-12-27 DIAGNOSIS — Z6841 Body Mass Index (BMI) 40.0 and over, adult: Secondary | ICD-10-CM | POA: Diagnosis not present

## 2021-12-27 DIAGNOSIS — G4733 Obstructive sleep apnea (adult) (pediatric): Secondary | ICD-10-CM | POA: Diagnosis not present

## 2021-12-27 DIAGNOSIS — E119 Type 2 diabetes mellitus without complications: Secondary | ICD-10-CM | POA: Diagnosis not present

## 2021-12-27 DIAGNOSIS — E785 Hyperlipidemia, unspecified: Secondary | ICD-10-CM | POA: Diagnosis not present

## 2021-12-28 DIAGNOSIS — M1712 Unilateral primary osteoarthritis, left knee: Secondary | ICD-10-CM | POA: Diagnosis not present

## 2021-12-28 DIAGNOSIS — Z96651 Presence of right artificial knee joint: Secondary | ICD-10-CM | POA: Diagnosis not present

## 2022-02-21 DIAGNOSIS — Z7984 Long term (current) use of oral hypoglycemic drugs: Secondary | ICD-10-CM | POA: Diagnosis not present

## 2022-02-21 DIAGNOSIS — I10 Essential (primary) hypertension: Secondary | ICD-10-CM | POA: Diagnosis not present

## 2022-02-21 DIAGNOSIS — G4700 Insomnia, unspecified: Secondary | ICD-10-CM | POA: Diagnosis not present

## 2022-02-21 DIAGNOSIS — E119 Type 2 diabetes mellitus without complications: Secondary | ICD-10-CM | POA: Diagnosis not present

## 2022-02-21 DIAGNOSIS — G4733 Obstructive sleep apnea (adult) (pediatric): Secondary | ICD-10-CM | POA: Diagnosis not present

## 2022-02-21 DIAGNOSIS — E785 Hyperlipidemia, unspecified: Secondary | ICD-10-CM | POA: Diagnosis not present

## 2022-02-21 DIAGNOSIS — I4892 Unspecified atrial flutter: Secondary | ICD-10-CM | POA: Diagnosis not present

## 2022-02-21 DIAGNOSIS — G8918 Other acute postprocedural pain: Secondary | ICD-10-CM | POA: Diagnosis not present

## 2022-02-21 DIAGNOSIS — I2510 Atherosclerotic heart disease of native coronary artery without angina pectoris: Secondary | ICD-10-CM | POA: Diagnosis not present

## 2022-02-21 DIAGNOSIS — M1712 Unilateral primary osteoarthritis, left knee: Secondary | ICD-10-CM | POA: Diagnosis not present

## 2022-02-21 DIAGNOSIS — Z7982 Long term (current) use of aspirin: Secondary | ICD-10-CM | POA: Diagnosis not present

## 2022-02-22 DIAGNOSIS — E119 Type 2 diabetes mellitus without complications: Secondary | ICD-10-CM | POA: Diagnosis not present

## 2022-02-22 DIAGNOSIS — Z7984 Long term (current) use of oral hypoglycemic drugs: Secondary | ICD-10-CM | POA: Diagnosis not present

## 2022-02-22 DIAGNOSIS — I4892 Unspecified atrial flutter: Secondary | ICD-10-CM | POA: Diagnosis not present

## 2022-02-22 DIAGNOSIS — I2510 Atherosclerotic heart disease of native coronary artery without angina pectoris: Secondary | ICD-10-CM | POA: Diagnosis not present

## 2022-02-22 DIAGNOSIS — I10 Essential (primary) hypertension: Secondary | ICD-10-CM | POA: Diagnosis not present

## 2022-02-22 DIAGNOSIS — Z7982 Long term (current) use of aspirin: Secondary | ICD-10-CM | POA: Diagnosis not present

## 2022-02-22 DIAGNOSIS — M1712 Unilateral primary osteoarthritis, left knee: Secondary | ICD-10-CM | POA: Diagnosis not present

## 2022-02-22 DIAGNOSIS — E785 Hyperlipidemia, unspecified: Secondary | ICD-10-CM | POA: Diagnosis not present

## 2022-02-22 DIAGNOSIS — G4700 Insomnia, unspecified: Secondary | ICD-10-CM | POA: Diagnosis not present

## 2022-03-07 ENCOUNTER — Ambulatory Visit: Payer: Medicare HMO | Admitting: Dermatology

## 2022-03-12 DIAGNOSIS — Z96651 Presence of right artificial knee joint: Secondary | ICD-10-CM | POA: Diagnosis not present

## 2022-03-12 DIAGNOSIS — M25562 Pain in left knee: Secondary | ICD-10-CM | POA: Insufficient documentation

## 2022-03-14 DIAGNOSIS — M25562 Pain in left knee: Secondary | ICD-10-CM | POA: Diagnosis not present

## 2022-03-14 DIAGNOSIS — Z96651 Presence of right artificial knee joint: Secondary | ICD-10-CM | POA: Diagnosis not present

## 2022-03-20 DIAGNOSIS — Z96651 Presence of right artificial knee joint: Secondary | ICD-10-CM | POA: Diagnosis not present

## 2022-03-20 DIAGNOSIS — M25562 Pain in left knee: Secondary | ICD-10-CM | POA: Diagnosis not present

## 2022-03-25 DIAGNOSIS — M25562 Pain in left knee: Secondary | ICD-10-CM | POA: Diagnosis not present

## 2022-03-25 DIAGNOSIS — M25662 Stiffness of left knee, not elsewhere classified: Secondary | ICD-10-CM | POA: Diagnosis not present

## 2022-03-29 DIAGNOSIS — Z96651 Presence of right artificial knee joint: Secondary | ICD-10-CM | POA: Diagnosis not present

## 2022-03-29 DIAGNOSIS — M25562 Pain in left knee: Secondary | ICD-10-CM | POA: Diagnosis not present

## 2022-04-01 DIAGNOSIS — Z96651 Presence of right artificial knee joint: Secondary | ICD-10-CM | POA: Diagnosis not present

## 2022-04-01 DIAGNOSIS — M1712 Unilateral primary osteoarthritis, left knee: Secondary | ICD-10-CM | POA: Diagnosis not present

## 2022-04-02 DIAGNOSIS — M25662 Stiffness of left knee, not elsewhere classified: Secondary | ICD-10-CM | POA: Diagnosis not present

## 2022-04-02 DIAGNOSIS — M25562 Pain in left knee: Secondary | ICD-10-CM | POA: Diagnosis not present

## 2022-04-09 DIAGNOSIS — M25562 Pain in left knee: Secondary | ICD-10-CM | POA: Diagnosis not present

## 2022-04-09 DIAGNOSIS — M25662 Stiffness of left knee, not elsewhere classified: Secondary | ICD-10-CM | POA: Diagnosis not present

## 2022-04-19 DIAGNOSIS — M25662 Stiffness of left knee, not elsewhere classified: Secondary | ICD-10-CM | POA: Diagnosis not present

## 2022-04-19 DIAGNOSIS — M25562 Pain in left knee: Secondary | ICD-10-CM | POA: Diagnosis not present

## 2022-04-23 DIAGNOSIS — M25562 Pain in left knee: Secondary | ICD-10-CM | POA: Diagnosis not present

## 2022-04-23 DIAGNOSIS — M25662 Stiffness of left knee, not elsewhere classified: Secondary | ICD-10-CM | POA: Diagnosis not present

## 2022-04-25 DIAGNOSIS — Z96651 Presence of right artificial knee joint: Secondary | ICD-10-CM | POA: Diagnosis not present

## 2022-04-25 DIAGNOSIS — M25662 Stiffness of left knee, not elsewhere classified: Secondary | ICD-10-CM | POA: Diagnosis not present

## 2022-04-25 DIAGNOSIS — M25562 Pain in left knee: Secondary | ICD-10-CM | POA: Diagnosis not present

## 2022-04-30 DIAGNOSIS — M25562 Pain in left knee: Secondary | ICD-10-CM | POA: Diagnosis not present

## 2022-04-30 DIAGNOSIS — M25662 Stiffness of left knee, not elsewhere classified: Secondary | ICD-10-CM | POA: Diagnosis not present

## 2022-05-02 DIAGNOSIS — M25662 Stiffness of left knee, not elsewhere classified: Secondary | ICD-10-CM | POA: Diagnosis not present

## 2022-05-02 DIAGNOSIS — M25562 Pain in left knee: Secondary | ICD-10-CM | POA: Diagnosis not present

## 2022-05-07 DIAGNOSIS — M25662 Stiffness of left knee, not elsewhere classified: Secondary | ICD-10-CM | POA: Diagnosis not present

## 2022-05-07 DIAGNOSIS — M25562 Pain in left knee: Secondary | ICD-10-CM | POA: Diagnosis not present

## 2022-05-09 DIAGNOSIS — M25562 Pain in left knee: Secondary | ICD-10-CM | POA: Diagnosis not present

## 2022-05-09 DIAGNOSIS — M25662 Stiffness of left knee, not elsewhere classified: Secondary | ICD-10-CM | POA: Diagnosis not present

## 2022-05-31 DIAGNOSIS — R55 Syncope and collapse: Secondary | ICD-10-CM | POA: Diagnosis not present

## 2022-06-11 DIAGNOSIS — N1831 Chronic kidney disease, stage 3a: Secondary | ICD-10-CM | POA: Diagnosis not present

## 2022-06-11 DIAGNOSIS — E1122 Type 2 diabetes mellitus with diabetic chronic kidney disease: Secondary | ICD-10-CM | POA: Diagnosis not present

## 2022-06-11 DIAGNOSIS — E7800 Pure hypercholesterolemia, unspecified: Secondary | ICD-10-CM | POA: Diagnosis not present

## 2022-06-11 DIAGNOSIS — Z125 Encounter for screening for malignant neoplasm of prostate: Secondary | ICD-10-CM | POA: Diagnosis not present

## 2022-07-02 ENCOUNTER — Telehealth: Payer: Self-pay | Admitting: *Deleted

## 2022-07-02 NOTE — Patient Outreach (Signed)
  Care Coordination   Initial Visit Note   07/03/2022 Name: Jackson Parks MRN: [id number] DOB: [birth year]  Jackson Parks is a 81 y.o. year old male who sees [name], MD for primary care. I spoke with  [name] by phone today.  What matters to the patients health and wellness today?  General health maintenance.     Goals Addressed             This Visit's Progress    COMPLETED: Maintain overall general health       Care Coordination Interventions: Evaluation of current treatment plan related to general health, including DM and patient's adherence to plan as established by provider Advised patient to Notify provider if having any concerns Reviewed medications with patient and discussed compliance and affordability Reviewed scheduled/upcoming provider appointments including AWV scheduled for 11/27 Screening for signs and symptoms of depression related to chronic disease state  Assessed social determinant of health barriers Discussed diet and exercise in effort to keep chronic conditions well controlled.  Current A1C is 7.1,blood sugars usually around 140s.           SDOH assessments and interventions completed:  Yes  SDOH Interventions Today    Flowsheet Row Most Recent Value  SDOH Interventions   Food Insecurity Interventions Intervention Not Indicated  Housing Interventions Intervention Not Indicated  Transportation Interventions Intervention Not Indicated  Utilities Interventions Intervention Not Indicated        Care Coordination Interventions Activated:  Yes  Care Coordination Interventions:  Yes, provided   Follow up plan: No further intervention required.   Encounter Outcome:  Pt. Visit Completed   Valente David, RN, MSN, [place] [facility] Care Management Coordinator [phone]

## 2022-07-03 ENCOUNTER — Encounter: Payer: Self-pay | Admitting: *Deleted

## 2022-07-08 DIAGNOSIS — Z1331 Encounter for screening for depression: Secondary | ICD-10-CM | POA: Diagnosis not present

## 2022-07-08 DIAGNOSIS — Z6841 Body Mass Index (BMI) 40.0 and over, adult: Secondary | ICD-10-CM | POA: Diagnosis not present

## 2022-07-08 DIAGNOSIS — Z79899 Other long term (current) drug therapy: Secondary | ICD-10-CM | POA: Diagnosis not present

## 2022-07-08 DIAGNOSIS — Z0000 Encounter for general adult medical examination without abnormal findings: Secondary | ICD-10-CM | POA: Diagnosis not present

## 2022-07-08 DIAGNOSIS — G4733 Obstructive sleep apnea (adult) (pediatric): Secondary | ICD-10-CM | POA: Diagnosis not present

## 2022-07-08 DIAGNOSIS — Z1211 Encounter for screening for malignant neoplasm of colon: Secondary | ICD-10-CM | POA: Diagnosis not present

## 2022-07-08 DIAGNOSIS — E119 Type 2 diabetes mellitus without complications: Secondary | ICD-10-CM | POA: Diagnosis not present

## 2022-10-28 ENCOUNTER — Ambulatory Visit: Payer: Medicare HMO | Admitting: Dermatology

## 2022-12-19 ENCOUNTER — Ambulatory Visit: Payer: Medicare HMO | Admitting: Dermatology

## 2022-12-19 DIAGNOSIS — W908XXA Exposure to other nonionizing radiation, initial encounter: Secondary | ICD-10-CM

## 2022-12-19 DIAGNOSIS — L814 Other melanin hyperpigmentation: Secondary | ICD-10-CM | POA: Diagnosis not present

## 2022-12-19 DIAGNOSIS — X32XXXA Exposure to sunlight, initial encounter: Secondary | ICD-10-CM | POA: Diagnosis not present

## 2022-12-19 DIAGNOSIS — D1801 Hemangioma of skin and subcutaneous tissue: Secondary | ICD-10-CM | POA: Diagnosis not present

## 2022-12-19 DIAGNOSIS — L820 Inflamed seborrheic keratosis: Secondary | ICD-10-CM

## 2022-12-19 DIAGNOSIS — Z85828 Personal history of other malignant neoplasm of skin: Secondary | ICD-10-CM

## 2022-12-19 DIAGNOSIS — Z1283 Encounter for screening for malignant neoplasm of skin: Secondary | ICD-10-CM | POA: Diagnosis not present

## 2022-12-19 DIAGNOSIS — L570 Actinic keratosis: Secondary | ICD-10-CM | POA: Diagnosis not present

## 2022-12-19 DIAGNOSIS — Z8589 Personal history of malignant neoplasm of other organs and systems: Secondary | ICD-10-CM

## 2022-12-19 DIAGNOSIS — L578 Other skin changes due to chronic exposure to nonionizing radiation: Secondary | ICD-10-CM

## 2022-12-19 DIAGNOSIS — L821 Other seborrheic keratosis: Secondary | ICD-10-CM | POA: Diagnosis not present

## 2022-12-19 NOTE — Patient Instructions (Signed)
Cryotherapy Aftercare  Wash gently with soap and water everyday.   Apply Vaseline and Band-Aid daily until healed.     Due to recent changes in healthcare laws, you may see results of your pathology and/or laboratory studies on MyChart before the doctors have had a chance to review them. We understand that in some cases there may be results that are confusing or concerning to you. Please understand that not all results are received at the same time and often the doctors may need to interpret multiple results in order to provide you with the best plan of care or course of treatment. Therefore, we ask that you please give us 2 business days to thoroughly review all your results before contacting the office for clarification. Should we see a critical lab result, you will be contacted sooner.   If You Need Anything After Your Visit  If you have any questions or concerns for your doctor, please call our main line at [phone] and press option 4 to reach your doctor's medical assistant. If no one answers, please leave a voicemail as directed and we will return your call as soon as possible. Messages left after 4 pm will be answered the following business day.   You may also send us a message via MyChart. We typically respond to MyChart messages within 1-2 business days.  For prescription refills, please ask your pharmacy to contact our office. Our fax number is [phone].  If you have an urgent issue when the clinic is closed that cannot wait until the next business day, you can page your doctor at the number below.    Please note that while we do our best to be available for urgent issues outside of office hours, we are not available 24/7.   If you have an urgent issue and are unable to reach us, you may choose to seek medical care at your doctor's office, retail clinic, urgent care center, or emergency room.  If you have a medical emergency, please immediately call 911 or go to the  emergency department.  Pager Numbers  - Dr. Kowalski: [phone]  - Dr. Moye: [phone]  - Dr. Stewart: [phone]  In the event of inclement weather, please call our main line at [phone] for an update on the status of any delays or closures.  Dermatology Medication Tips: Please keep the boxes that topical medications come in in order to help keep track of the instructions about where and how to use these. Pharmacies typically print the medication instructions only on the boxes and not directly on the medication tubes.   If your medication is too expensive, please contact our office at [phone] option 4 or send us a message through MyChart.   We are unable to tell what your co-pay for medications will be in advance as this is different depending on your insurance coverage. However, we may be able to find a substitute medication at lower cost or fill out paperwork to get insurance to cover a needed medication.   If a prior authorization is required to get your medication covered by your insurance company, please allow us 1-2 business days to complete this process.  Drug prices often vary depending on where the prescription is filled and some pharmacies may offer cheaper prices.  The website www.goodrx.com contains coupons for medications through different pharmacies. The prices here do not account for what the cost may be with help from insurance (it may be cheaper with your insurance), but the website can   give you the price if you did not use any insurance.  - You can print the associated coupon and take it with your prescription to the pharmacy.  - You may also stop by our office during regular business hours and pick up a GoodRx coupon card.  - If you need your prescription sent electronically to a different pharmacy, notify our office through [facility] MyChart or by phone at [phone] option 4.     Si Usted Necesita Algo Despus de Su Visita  Tambin puede  enviarnos un mensaje a travs de MyChart. Por lo general respondemos a los mensajes de MyChart en el transcurso de 1 a 2 das hbiles.  Para renovar recetas, por favor pida a su farmacia que se ponga en contacto con nuestra oficina. Nuestro nmero de fax es el [phone].  Si tiene un asunto urgente cuando la clnica est cerrada y que no puede esperar hasta el siguiente da hbil, puede llamar/localizar a su doctor(a) al nmero que aparece a continuacin.   Por favor, tenga en cuenta que aunque hacemos todo lo posible para estar disponibles para asuntos urgentes fuera del horario de oficina, no estamos disponibles las 24 horas del da, los 7 das de la semana.   Si tiene un problema urgente y no puede comunicarse con nosotros, puede optar por buscar atencin mdica  en el consultorio de su doctor(a), en una clnica privada, en un centro de atencin urgente o en una sala de emergencias.  Si tiene una emergencia mdica, por favor llame inmediatamente al 911 o vaya a la sala de emergencias.  Nmeros de bper  - Dr. Kowalski: [phone]  - Dra. Moye: [phone]  - Dra. Stewart: [phone]  En caso de inclemencias del tiempo, por favor llame a nuestra lnea principal al [phone] para una actualizacin sobre el estado de cualquier retraso o cierre.  Consejos para la medicacin en dermatologa: Por favor, guarde las cajas en las que vienen los medicamentos de uso tpico para ayudarle a seguir las instrucciones sobre dnde y cmo usarlos. Las farmacias generalmente imprimen las instrucciones del medicamento slo en las cajas y no directamente en los tubos del medicamento.   Si su medicamento es muy caro, por favor, pngase en contacto con nuestra oficina llamando al [phone] y presione la opcin 4 o envenos un mensaje a travs de MyChart.   No podemos decirle cul ser su copago por los medicamentos por adelantado ya que esto es diferente dependiendo de la cobertura de su seguro.  Sin embargo, es posible que podamos encontrar un medicamento sustituto a menor costo o llenar un formulario para que el seguro cubra el medicamento que se considera necesario.   Si se requiere una autorizacin previa para que su compaa de seguros cubra su medicamento, por favor permtanos de 1 a 2 das hbiles para completar este proceso.  Los precios de los medicamentos varan con frecuencia dependiendo del lugar de dnde se surte la receta y alguna farmacias pueden ofrecer precios ms baratos.  El sitio web www.goodrx.com tiene cupones para medicamentos de diferentes farmacias. Los precios aqu no tienen en cuenta lo que podra costar con la ayuda del seguro (puede ser ms barato con su seguro), pero el sitio web puede darle el precio si no utiliz ningn seguro.  - Puede imprimir el cupn correspondiente y llevarlo con su receta a la farmacia.  - Tambin puede pasar por nuestra oficina durante el horario de atencin regular y recoger una tarjeta de cupones de GoodRx.  -   Si necesita que su receta se enve electrnicamente a una farmacia diferente, informe a nuestra oficina a travs de MyChart de [place] [facility] o por telfono llamando al [phone] y presione la opcin 4.  

## 2022-12-19 NOTE — Progress Notes (Signed)
Follow-Up Visit   Subjective  Jackson Parks is a 82 y.o. male who presents for the following: Skin Cancer Screening and Upper Body Skin Exam - History BCC, SCC  The patient presents for Upper Body Skin Exam (UBSE) for skin cancer screening and mole check. The patient has spots, moles and lesions to be evaluated, some may be new or changing and the patient has concerns that these could be cancer.    The following portions of the chart were reviewed this encounter and updated as appropriate: medications, allergies, medical history  Review of Systems:  No other skin or systemic complaints except as noted in HPI or Assessment and Plan.  Objective  Well appearing patient in no apparent distress; mood and affect are within normal limits.  All skin waist up examined. Relevant physical exam findings are noted in the Assessment and Plan.  Scalp, face, ears (15) Erythematous thin papules/macules with gritty scale.   Mid Forehead Erythematous stuck-on, waxy papule or plaque    Assessment & Plan   HISTORY OF BASAL CELL CARCINOMA OF THE SKIN - No evidence of recurrence today - Recommend regular full body skin exams - Recommend daily broad spectrum sunscreen SPF 30+ to sun-exposed areas, reapply every 2 hours as needed.  - Call if any new or changing lesions are noted between office visits  HISTORY OF SQUAMOUS CELL CARCINOMA OF THE SKIN - No evidence of recurrence today - No lymphadenopathy - Recommend regular full body skin exams - Recommend daily broad spectrum sunscreen SPF 30+ to sun-exposed areas, reapply every 2 hours as needed.  - Call if any new or changing lesions are noted between office visits   AK (actinic keratosis) (15) Scalp, face, ears  Destruction of lesion - Scalp, face, ears Complexity: simple   Destruction method: cryotherapy   Informed consent: discussed and consent obtained   Timeout:  patient name, date of birth, surgical site, and procedure  verified Lesion destroyed using liquid nitrogen: Yes   Region frozen until ice ball extended beyond lesion: Yes   Outcome: patient tolerated procedure well with no complications   Post-procedure details: wound care instructions given    Inflamed seborrheic keratosis Mid Forehead  Destruction of lesion - Mid Forehead Complexity: simple   Destruction method: cryotherapy   Informed consent: discussed and consent obtained   Timeout:  patient name, date of birth, surgical site, and procedure verified Lesion destroyed using liquid nitrogen: Yes   Region frozen until ice ball extended beyond lesion: Yes   Outcome: patient tolerated procedure well with no complications   Post-procedure details: wound care instructions given     Lentigines, Seborrheic Keratoses, Hemangiomas - Benign normal skin lesions - Benign-appearing - Call for any changes  Melanocytic Nevi - Tan-brown and/or pink-flesh-colored symmetric macules and papules - Benign appearing on exam today - Observation - Call clinic for new or changing moles - Recommend daily use of broad spectrum spf 30+ sunscreen to sun-exposed areas.   Actinic Damage - Chronic condition, secondary to cumulative UV/sun exposure - diffuse scaly erythematous macules with underlying dyspigmentation - Recommend daily broad spectrum sunscreen SPF 30+ to sun-exposed areas, reapply every 2 hours as needed.  - Staying in the shade or wearing long sleeves, sun glasses (UVA+UVB protection) and wide brim hats (4-inch brim around the entire circumference of the hat) are also recommended for sun protection.  - Call for new or changing lesions.  Skin cancer screening performed today.  No follow-ups on file.  I, Joanie Coddington,  CMA, am acting as scribe for Armida Sans, MD .   Documentation: I have reviewed the above documentation for accuracy and completeness, and I agree with the above.  Armida Sans, MD

## 2022-12-20 DIAGNOSIS — H1045 Other chronic allergic conjunctivitis: Secondary | ICD-10-CM | POA: Diagnosis not present

## 2022-12-20 DIAGNOSIS — E119 Type 2 diabetes mellitus without complications: Secondary | ICD-10-CM | POA: Diagnosis not present

## 2022-12-20 DIAGNOSIS — H40013 Open angle with borderline findings, low risk, bilateral: Secondary | ICD-10-CM | POA: Diagnosis not present

## 2022-12-24 ENCOUNTER — Encounter: Payer: Self-pay | Admitting: Dermatology

## 2022-12-30 DIAGNOSIS — N1831 Chronic kidney disease, stage 3a: Secondary | ICD-10-CM | POA: Diagnosis not present

## 2022-12-30 DIAGNOSIS — Z79899 Other long term (current) drug therapy: Secondary | ICD-10-CM | POA: Diagnosis not present

## 2022-12-30 DIAGNOSIS — E1122 Type 2 diabetes mellitus with diabetic chronic kidney disease: Secondary | ICD-10-CM | POA: Diagnosis not present

## 2022-12-30 DIAGNOSIS — I10 Essential (primary) hypertension: Secondary | ICD-10-CM | POA: Diagnosis not present

## 2023-01-10 NOTE — Progress Notes (Unsigned)
HPI male former smoker followed here for OSA/CPAP and for mild COPD, complicated by DM 2, CAD/stent, a flutter/ablation PFT-12/21/15-minimal obstructive airways disease-FVC 3.82/95%, FEV1 3.23/110%, ratio 0.85, TLC 88%, DLCO 77% NPSG- [facility]- AHI 31.8/ hr,  desaturation to 81%, body weight 307 lbs  -------------------------------------------------------------------------------------   11/01/21- 82 year old male former smoker followed here for OSA/CPAP and for mild COPD, complicated by DM 2, CAD/stent, A Flutter/ablation -no inhalers, trazodone 100,  CPAP 15/ [facility] New machine 2016               Download-    Body weight today- 287 lbs Covid vax- 2 Phizer Flu vax-declines -----Patient is doing good, no concerns. Having knee replacement April 5th. Pending TKR with no special problems anticipated. He is comfortable with his CPAP.  We discussed old machine appropriate for replacement with change to AutoPap.  01/13/23-  82 year old male former smoker followed here for OSA/CPAP and for mild COPD, complicated by DM 2, CAD/stent, A Flutter/ablation -no inhalers, trazodone 100,  CPAP 15/ [facility] New machine 2016               Download-    Body weight today-    ROS-see HPI   + = positive Constitutional:    +diet loss, night sweats, fevers, chills, fatigue, lassitude. HEENT:    headaches, difficulty swallowing, tooth/dental problems, sore throat,       sneezing, itching, ear ache, +nasal congestion, post nasal drip, snoring CV:    chest pain, orthopnea, PND, swelling in lower extremities, anasarca,                              dizziness, palpitations Resp:   shortness of breath with exertion or at rest.                productive cough,   non-productive cough, coughing up of blood.              change in color of mucus.  wheezing.   Skin:    rash or lesions. GI:  No-   heartburn, indigestion, abdominal pain, nausea, vomiting, diarrhea,                 change in bowel  habits, loss of appetite GU: dysuria, change in color of urine, no urgency or frequency.   flank pain. MS:   +joint pain, stiffness, decreased range of motion, back pain. Neuro-     nothing unusual Psych:  change in mood or affect.  depression or anxiety.   memory loss.  OBJ- Physical Exam General- Alert, Oriented, Affect-appropriate, Distress- none acute, + obese Skin- rash-none, lesions- none, excoriation- none Lymphadenopathy- none Head- atraumatic            Eyes- Gross vision intact, PERRLA, conjunctivae and secretions clear            Ears- Hearing, canals-normal            Nose- no-Septal dev, mucus, polyps, erosion, perforation             Throat- Mallampati IV, mucosa clear , drainage- none, tonsils- atrophic, has lower teeth, otherwise edentulous Neck- flexible , trachea midline, no stridor , thyroid nl, carotid no bruit Chest - symmetrical excursion , unlabored           Heart/CV- RRR , no murmur , no gallop  , no rub, nl s1 s2  JVD- none , edema- none, stasis changes- none, varices- none           Lung- + clear, wheeze- none, cough- none , dullness-none, rub- none           Chest wall-  Abd-  Br/ Gen/ Rectal- Not done, not indicated Extrem- cyanosis- none, clubbing, none, atrophy- none, strength- nl Neuro- grossly intact to observation

## 2023-01-10 NOTE — Progress Notes (Signed)
Patient ID: Jackson Parks, male   DOB: [birth year], 82 y.o.   MRN: [id number]    Date:  01/13/2023   ID:  Jackson Parks, DOB 11/12/1940, MRN [id number]  PCP:  Kandyce Rud, MD  Primary Cardiologist:  Tyhesha Dutson [place] MD  Chief Complaint  Patient presents with   Coronary Artery Disease   Atrial Flutter     History of Present Illness: Jackson Parks is a 82 y.o. male with a history of atrial flutter status post DC cardioversion. He subsequently had an ablation procedure by Dr. Graciela Husbands in April 2014. He has a history of coronary disease and is status post stenting of the diagonal branch in 2006. Cardiac catheterization in February 2014 showed continued patency of the stent and nonobstructive disease. He also had normal right heart pressures at that time. He does have obstructive sleep apnea and is on CPAP therapy. Followed by Dr Maple Hudson.   He has had bilateral knee replacement in the past year.   Apparently this past winter he was cleaning catfish and frying them. Had been out in his shop all day. Had just turned off the burner when he fell back. Thinks he blacked out but doesn't know how long. After several days did go get checked by PCP. No recurrence since then. He has been active doing yard work and mowing. No chest pain, dizziness, or dyspnea. Is intolerant of even low dose Crestor. Now on Zetia.   On follow up today he is doing very well. He denies any chest pain, dyspnea, edema, or palpitations. He is active and just put up 720 lbs of sausage.   He is is taking low dose Crestor. He uses his CPAP every night. He denies any palpitations, dyspnea or chest pain. Reports he lost 30 lbs   Wt Readings from Last 3 Encounters:  01/13/23 137 lb 12.8 oz (62.5 kg)  11/01/21 287 lb 9.6 oz (130.5 kg)  11/01/21 287 lb (130.2 kg)     Past Medical History:  Diagnosis Date   Actinic keratosis 01/19/2007   L clavicle   Atrial flutter (HCC) 10/05/2012   Basal cell carcinoma 01/19/2007   L ant  shoulder   BCC (basal cell carcinoma of skin) 05/16/2021   L ant shoulder - excised 05/29/21   CAD (coronary artery disease)    COPD (chronic obstructive pulmonary disease) (HCC)    Diabetes mellitus without complication (HCC)    DM (dermatomyositis)    Hypercholesterolemia    Obesity    Squamous cell carcinoma of skin 09/19/2008   R post lat shoulder SCCIS   Squamous cell carcinoma of skin 09/19/2008   R med upper back SCCIS   Squamous cell carcinoma of skin 06/05/2021   L mid infra clavicular - SCCIS arising in AK - needs treatment    Current Outpatient Medications  Medication Sig Dispense Refill   acetaminophen (TYLENOL) 500 MG tablet Take 500 mg by mouth every 6 (six) hours as needed.     aspirin EC 81 MG tablet Take 81 mg by mouth daily.     ezetimibe (ZETIA) 10 MG tablet Take 10 mg by mouth daily.     glimepiride (AMARYL) 2 MG tablet Take 2 mg by mouth daily before breakfast.     lisinopril (PRINIVIL,ZESTRIL) 10 MG tablet TAKE 1 TABLET BY MOUTH  EVERY EVENING 90 tablet 3   NONFORMULARY OR COMPOUNDED ITEM Apply 1-2 g topically 4 (four) times daily. 120 each 2   sitaGLIPtan-metformin (JANUMET) 50-1000 MG per tablet Take 1 tablet  by mouth 2 (two) times daily with a meal.     traZODone (DESYREL) 100 MG tablet Take 1 tablet by mouth at bedtime.     Current Facility-Administered Medications  Medication Dose Route Frequency Provider Last Rate Last Admin   betamethasone acetate-betamethasone sodium phosphate (CELESTONE) injection 3 mg  3 mg Intramuscular Once Felecia Shelling, DPM        Allergies:    Allergies  Allergen Reactions   Nsaids Other (See Comments)    Sever GI upset    Statins     Muscle pain   Tolmetin Other (See Comments)    Sever GI upset     Social History:  The patient  reports that he quit smoking about 22 years ago. His smoking use included cigarettes. He has a 117.00 pack-year smoking history. He has never used smokeless tobacco. He reports current alcohol  use. He reports that he does not use drugs.   Family history:   Family History  Problem Relation Age of Onset   Heart failure Mother 11       deceased   Heart attack Father 52       deceased   Coronary artery disease Brother 40   Peripheral vascular disease Brother 109    ROS:  Please see the history of present illness.  All other systems reviewed and negative.   PHYSICAL EXAM: VS:  BP 124/84   Pulse 74   Ht 5\' 11"  (1.803 m)   Wt 137 lb 12.8 oz (62.5 kg)   SpO2 94%   BMI 19.22 kg/m  GENERAL:  Well appearing, obese WM in NAD HEENT:  PERRL, EOMI, sclera are clear. Oropharynx is clear. NECK:  No jugular venous distention, carotid upstroke brisk and symmetric, no bruits, no thyromegaly or adenopathy LUNGS:  Clear to auscultation bilaterally CHEST:  Unremarkable HEART:  RRR,  PMI not displaced or sustained,S1 and S2 within normal limits, no S3, no S4: no clicks, no rubs, no murmurs ABD:  Soft, nontender. BS +, no masses or bruits. No hepatomegaly, no splenomegaly EXT:  2 + pulses throughout, no edema, no cyanosis no clubbing SKIN:  Warm and dry.  No rashes NEURO:  Alert and oriented x 3. Cranial nerves II through XII intact. PSYCH:  Cognitively intact     Labs reviewed from 06/06/16: Hgb 13.7. Otherwise CBC normal. Glucose 149. Otherwise CMET is normal. A1c 7.5%. Cholesterol 184, trig-155, HDL 50, LDL 103. TSH normal. Dated 12/12/16: cholesterol 188, triglycerides 306, HDL 52, LDL 75. Hgb 14. Glucose 169. A1c 7.8. Otherwise CMET normal. Dated 08/27/17: cholesterol 168, triglycerides 189, HDL 51, LDL 79. A1c 7.7%. Glucose 186. Other chemistries normal. hgb 13.9.  Dated 08/31/18: Hgb 13.7. glucose 162, other chemistries normal. A1c 7.6%. cholesterol 184, triglycerides 223, HDL 48. LDL 91.  Dated 03/16/19: Hgb 12.8. glucose 149, sodium 135. Other Chemistries normal. A1c 7.2%. cholesterol 161, triglycerides 232, HDL 45, LDL 70.  Dated 04/27/20: cholesterol 197, triglycerides 161, HDL 41,  LDL 124. A1c 6.3%. Hgb 13.4. TSH and CMET normal Dated 12/30/22: cholesterol 163, triglycerides 143, HDL 48, LDL 86. A1c 6.2%. Hgb 12.6. CMET normal. TSH normal.   Ecg today shows NSR rate 74, LAD. RBBB. I have personally reviewed and interpreted this study.  ASSESSMENT AND PLAN:  Problem List Items Addressed This Visit     HYPERCHOLESTEROLEMIA - Primary   Relevant Medications   ezetimibe (ZETIA) 10 MG tablet   Other Relevant Orders   EKG 12-Lead   AMB Referral to  [facility]   CAD (coronary artery disease)   Relevant Medications   ezetimibe (ZETIA) 10 MG tablet   Other Relevant Orders   EKG 12-Lead   AMB Referral to [facility] Pharm[facility]    1. Atrial flutter- s/p ablation in 2014 without recurrence.  2. CAD with remote stent of the diagonal. He is asymptomatic. Continue ASA  3. Hypercholesterolemia. Goal LDL <70. Intolerant of statins. On Zetia with improvement but still not at goal. Recommend referral to our lipid clinic to see if we can get on Repatha or Leqvio.   4. Morbid obesity with OSA. On CPAP. Encourage weight loss. Followed by Dr Maple Hudson  5. Diabetes mellitus: Followed by primary. Again needs to focus on dietary modification and weight loss. Last A1c 6.2%   follow-up in one year.

## 2023-01-13 ENCOUNTER — Encounter: Payer: Self-pay | Admitting: Cardiology

## 2023-01-13 ENCOUNTER — Encounter: Payer: Self-pay | Admitting: Internal Medicine

## 2023-01-13 ENCOUNTER — Ambulatory Visit: Payer: Medicare HMO | Admitting: Internal Medicine

## 2023-01-13 ENCOUNTER — Ambulatory Visit: Payer: Medicare HMO | Attending: Cardiology | Admitting: Cardiology

## 2023-01-13 VITALS — BP 124/84 | HR 74 | Ht 71.0 in | Wt 137.8 lb

## 2023-01-13 VITALS — BP 122/72 | HR 72 | Ht 71.0 in | Wt 279.0 lb

## 2023-01-13 DIAGNOSIS — E7800 Pure hypercholesterolemia, unspecified: Secondary | ICD-10-CM | POA: Diagnosis not present

## 2023-01-13 DIAGNOSIS — G4733 Obstructive sleep apnea (adult) (pediatric): Secondary | ICD-10-CM | POA: Diagnosis not present

## 2023-01-13 DIAGNOSIS — I2510 Atherosclerotic heart disease of native coronary artery without angina pectoris: Secondary | ICD-10-CM | POA: Diagnosis not present

## 2023-01-13 DIAGNOSIS — J449 Chronic obstructive pulmonary disease, unspecified: Secondary | ICD-10-CM

## 2023-01-13 DIAGNOSIS — I483 Typical atrial flutter: Secondary | ICD-10-CM | POA: Diagnosis not present

## 2023-01-13 NOTE — Assessment & Plan Note (Signed)
Benefits from CPAP with good compliance and control Plan- continue auto 10-20 

## 2023-01-13 NOTE — Patient Instructions (Signed)
We can continue CPAP auto 10-20  Please call if we can help 

## 2023-01-13 NOTE — Assessment & Plan Note (Signed)
Average lifestyle changes to encourage diet and exercise.

## 2023-01-13 NOTE — Patient Instructions (Signed)
Medication Instructions:  Continue same medications *If you need a refill on your cardiac medications before your next appointment, please call your pharmacy*   Lab Work: None  ordered  Testing/Procedures: None ordered   Follow-Up: At [facility], you and your health needs are our priority.  As part of our continuing mission to provide you with exceptional heart care, we have created designated Provider Care Teams.  These Care Teams include your primary Cardiologist (physician) and Advanced Practice Providers (APPs -  Physician Assistants and Nurse Practitioners) who all work together to provide you with the care you need, when you need it.  We recommend signing up for the patient portal called "MyChart".  Sign up information is provided on this After Visit Summary.  MyChart is used to connect with patients for Virtual Visits (Telemedicine).  Patients are able to view lab/test results, encounter notes, upcoming appointments, etc.  Non-urgent messages can be sent to your provider as well.   To learn more about what you can do with MyChart, go to [email or web address].    Your next appointment:  1 year    Call in March to schedule June appointment     Provider:  Dr.Jordan

## 2023-01-13 NOTE — Assessment & Plan Note (Signed)
He reports his breathing is stable with no cough or wheeze.  He has not been using any inhalers and is comfortable currently.  This can be reassessed as needed.

## 2023-01-15 ENCOUNTER — Telehealth: Payer: Self-pay | Admitting: Pharmacist

## 2023-01-15 ENCOUNTER — Telehealth: Payer: Self-pay

## 2023-01-15 ENCOUNTER — Encounter: Payer: Self-pay | Admitting: Pharmacist

## 2023-01-15 ENCOUNTER — Ambulatory Visit: Payer: Medicare HMO | Attending: Cardiovascular Disease | Admitting: Pharmacist

## 2023-01-15 ENCOUNTER — Other Ambulatory Visit (HOSPITAL_COMMUNITY): Payer: Self-pay

## 2023-01-15 DIAGNOSIS — M791 Myalgia, unspecified site: Secondary | ICD-10-CM | POA: Diagnosis not present

## 2023-01-15 DIAGNOSIS — I2510 Atherosclerotic heart disease of native coronary artery without angina pectoris: Secondary | ICD-10-CM

## 2023-01-15 DIAGNOSIS — E7800 Pure hypercholesterolemia, unspecified: Secondary | ICD-10-CM

## 2023-01-15 DIAGNOSIS — T466X5A Adverse effect of antihyperlipidemic and antiarteriosclerotic drugs, initial encounter: Secondary | ICD-10-CM | POA: Diagnosis not present

## 2023-01-15 NOTE — Progress Notes (Signed)
Patient ID: Jackson Parks                 DOB: [birth year]                    MRN: [id number]     HPI: Jackson Parks is a 82 y.o. male patient referred to lipid clinic by Dr [place]. PMH is significant for CAD, aortic atherosclerosis, COPD, OSA, and T2DM. Patient is statin intolerant.  Patient presents today with wife who has been researching alternate LDL lowering medications such as Repatha and Leqvio. She contacted her insurance company and believes Repatha will be cheaper.  Patient has tried simvastatin, rosuvastatin, and lovastatin. All caused myalgias. Currently on ezetimibe with no reported adverse effects.    Patient reports his diet is poor, will eat pretty much anything whether it is heart healthy or not. Denies alcohol or tobacco.  Reports he is active during the day gardening and mowing.  Current Medications: ezetimibe 10mg   Intolerances:  Lovastatin Rosuvastatin Simvastatin  Risk Factors:  CAD T2DM  LDL goal: <55  Labs: TC 163, Trigs 143, HDL 48.1, LDL 86 (not on any meds)   Past Medical History:  Diagnosis Date   Actinic keratosis 01/19/2007   L clavicle   Atrial flutter (HCC) 10/05/2012   Basal cell carcinoma 01/19/2007   L ant shoulder   BCC (basal cell carcinoma of skin) 05/16/2021   L ant shoulder - excised 05/29/21   CAD (coronary artery disease)    COPD (chronic obstructive pulmonary disease) (HCC)    Diabetes mellitus without complication (HCC)    DM (dermatomyositis)    Hypercholesterolemia    Obesity    Squamous cell carcinoma of skin 09/19/2008   R post lat shoulder SCCIS   Squamous cell carcinoma of skin 09/19/2008   R med upper back SCCIS   Squamous cell carcinoma of skin 06/05/2021   L mid infra clavicular - SCCIS arising in AK - needs treatment    Current Outpatient Medications on File Prior to Visit  Medication Sig Dispense Refill   acetaminophen (TYLENOL) 500 MG tablet Take 500 mg by mouth every 6 (six) hours as needed.      aspirin EC 81 MG tablet Take 81 mg by mouth daily.     ezetimibe (ZETIA) 10 MG tablet Take 10 mg by mouth daily.     glimepiride (AMARYL) 2 MG tablet Take 2 mg by mouth daily before breakfast.     lisinopril (PRINIVIL,ZESTRIL) 10 MG tablet TAKE 1 TABLET BY MOUTH  EVERY EVENING 90 tablet 3   NONFORMULARY OR COMPOUNDED ITEM Apply 1-2 g topically 4 (four) times daily. 120 each 2   sitaGLIPtan-metformin (JANUMET) 50-1000 MG per tablet Take 1 tablet by mouth 2 (two) times daily with a meal.     traZODone (DESYREL) 100 MG tablet Take 1 tablet by mouth at bedtime.     Current Facility-Administered Medications on File Prior to Visit  Medication Dose Route Frequency Provider Last Rate Last Admin   betamethasone acetate-betamethasone sodium phosphate (CELESTONE) injection 3 mg  3 mg Intramuscular Once Felecia Shelling, DPM        Allergies  Allergen Reactions   Nsaids Other (See Comments)    Sever GI upset    Statins     Muscle pain   Tolmetin Other (See Comments)    Sever GI upset     Assessment/Plan:  1. Hyperlipidemia - Patient last LDL 86 which is above goal of <55. Advised that  Repatha would be more affordable for him and he has not contraindications to it. Using demo pen, educated patient on mechanism of action, storage, site selection, administration, and possible adverse effects. Patient able to demonstrate in room.  Will complete PA and contact patient wen approved. Recheck lipid panel in 2-3 months. Patients wife signed him up for [employer] by herself.  Start Repatha 140mg  q 2 weeks Continue ezetimibe 10mg  daily  Laural Golden, PharmD, BCACP, CDCES, CPP [address] [place], [place], [address] Phone: [phone], Fax: [phone]

## 2023-01-15 NOTE — Telephone Encounter (Signed)
Pharmacy Patient Advocate Encounter   Received notification from [facility] that prior authorization for REPATHA 140MG /ML is required/requested.   PA submitted to [facility] via CoverMyMeds Key or (Medicaid) confirmation # [id number] Status is pending

## 2023-01-15 NOTE — Patient Instructions (Signed)
It was nice meeting you today  We would like your LDL (bad cholesterol) to be less than 55  The medication we are recommending is called Repatha which you would inject once every 2 weeks  I will complete the prior authorization for you and contact you when it is approved  Bring your grant information to your pharmacy with you once they receive the prescription  We will recheck your cholesterol levels in about 2-3 months  Please let us know if you have any questions  Laural Golden, PharmD, BCACP, CDCES, CPP [address] [place], [place], [address] Phone: [phone], Fax: [phone]

## 2023-01-15 NOTE — Telephone Encounter (Signed)
Please complete PA for Repatha 

## 2023-01-16 NOTE — Telephone Encounter (Signed)
Pharmacy Patient Advocate Encounter  Prior Authorization for Repatha [employer] has been APPROVED by [facility] from 01/15/2023 to 08/12/2023.

## 2023-01-22 ENCOUNTER — Other Ambulatory Visit (HOSPITAL_COMMUNITY): Payer: Self-pay

## 2023-01-22 MED ORDER — REPATHA SURECLICK 140 MG/ML ~~LOC~~ SOAJ
1.0000 mL | SUBCUTANEOUS | 3 refills | Status: DC
Start: 2023-01-22 — End: 2024-01-09
  Filled 2023-01-22 – 2023-02-05 (×5): qty 6, 84d supply, fill #0
  Filled 2023-06-13: qty 6, 84d supply, fill #1
  Filled 2023-09-08: qty 6, 84d supply, fill #2
  Filled 2024-01-02: qty 6, 84d supply, fill #3

## 2023-01-22 NOTE — Telephone Encounter (Signed)
Spoke with wife and advised of PA approval.  Requests Rx delivery from [facility] pharmacy. Patient and wife going out of town tomorrow so advised when pharmacy calls she can set delivery date. Wife aware she will need to give [employer] grant information to pharmacy

## 2023-01-23 DIAGNOSIS — E785 Hyperlipidemia, unspecified: Secondary | ICD-10-CM | POA: Diagnosis not present

## 2023-01-23 DIAGNOSIS — E119 Type 2 diabetes mellitus without complications: Secondary | ICD-10-CM | POA: Diagnosis not present

## 2023-01-23 DIAGNOSIS — G4733 Obstructive sleep apnea (adult) (pediatric): Secondary | ICD-10-CM | POA: Diagnosis not present

## 2023-01-23 DIAGNOSIS — I10 Essential (primary) hypertension: Secondary | ICD-10-CM | POA: Diagnosis not present

## 2023-01-23 DIAGNOSIS — J449 Chronic obstructive pulmonary disease, unspecified: Secondary | ICD-10-CM | POA: Diagnosis not present

## 2023-01-23 DIAGNOSIS — Z79899 Other long term (current) drug therapy: Secondary | ICD-10-CM | POA: Diagnosis not present

## 2023-02-03 ENCOUNTER — Other Ambulatory Visit (HOSPITAL_COMMUNITY): Payer: Self-pay

## 2023-02-03 DIAGNOSIS — M25551 Pain in right hip: Secondary | ICD-10-CM | POA: Diagnosis not present

## 2023-02-03 DIAGNOSIS — M25552 Pain in left hip: Secondary | ICD-10-CM | POA: Diagnosis not present

## 2023-02-03 DIAGNOSIS — R269 Unspecified abnormalities of gait and mobility: Secondary | ICD-10-CM | POA: Diagnosis not present

## 2023-02-03 DIAGNOSIS — Z96653 Presence of artificial knee joint, bilateral: Secondary | ICD-10-CM | POA: Diagnosis not present

## 2023-02-04 DIAGNOSIS — M25552 Pain in left hip: Secondary | ICD-10-CM | POA: Diagnosis not present

## 2023-02-04 DIAGNOSIS — R2689 Other abnormalities of gait and mobility: Secondary | ICD-10-CM | POA: Diagnosis not present

## 2023-02-04 DIAGNOSIS — M25551 Pain in right hip: Secondary | ICD-10-CM | POA: Diagnosis not present

## 2023-02-05 ENCOUNTER — Other Ambulatory Visit (HOSPITAL_COMMUNITY): Payer: Self-pay

## 2023-02-05 ENCOUNTER — Other Ambulatory Visit: Payer: Self-pay

## 2023-02-06 ENCOUNTER — Other Ambulatory Visit (HOSPITAL_COMMUNITY): Payer: Self-pay

## 2023-02-06 DIAGNOSIS — M25551 Pain in right hip: Secondary | ICD-10-CM | POA: Diagnosis not present

## 2023-02-06 DIAGNOSIS — M25552 Pain in left hip: Secondary | ICD-10-CM | POA: Diagnosis not present

## 2023-02-06 DIAGNOSIS — R2689 Other abnormalities of gait and mobility: Secondary | ICD-10-CM | POA: Diagnosis not present

## 2023-02-26 DIAGNOSIS — R2689 Other abnormalities of gait and mobility: Secondary | ICD-10-CM | POA: Diagnosis not present

## 2023-02-26 DIAGNOSIS — M25552 Pain in left hip: Secondary | ICD-10-CM | POA: Diagnosis not present

## 2023-02-26 DIAGNOSIS — M25551 Pain in right hip: Secondary | ICD-10-CM | POA: Diagnosis not present

## 2023-03-20 DIAGNOSIS — M25552 Pain in left hip: Secondary | ICD-10-CM | POA: Diagnosis not present

## 2023-03-20 DIAGNOSIS — M25551 Pain in right hip: Secondary | ICD-10-CM | POA: Diagnosis not present

## 2023-03-20 DIAGNOSIS — R2689 Other abnormalities of gait and mobility: Secondary | ICD-10-CM | POA: Diagnosis not present

## 2023-03-21 DIAGNOSIS — G5602 Carpal tunnel syndrome, left upper limb: Secondary | ICD-10-CM | POA: Diagnosis not present

## 2023-04-02 DIAGNOSIS — G5602 Carpal tunnel syndrome, left upper limb: Secondary | ICD-10-CM | POA: Diagnosis not present

## 2023-04-18 DIAGNOSIS — G5602 Carpal tunnel syndrome, left upper limb: Secondary | ICD-10-CM | POA: Diagnosis not present

## 2023-04-24 DIAGNOSIS — G5602 Carpal tunnel syndrome, left upper limb: Secondary | ICD-10-CM | POA: Diagnosis not present

## 2023-06-13 ENCOUNTER — Other Ambulatory Visit (HOSPITAL_COMMUNITY): Payer: Self-pay

## 2023-06-19 ENCOUNTER — Ambulatory Visit: Payer: Medicare HMO | Admitting: Dermatology

## 2023-06-19 ENCOUNTER — Encounter: Payer: Self-pay | Admitting: Dermatology

## 2023-06-19 DIAGNOSIS — S00412A Abrasion of left ear, initial encounter: Secondary | ICD-10-CM | POA: Diagnosis not present

## 2023-06-19 DIAGNOSIS — L0100 Impetigo, unspecified: Secondary | ICD-10-CM

## 2023-06-19 DIAGNOSIS — L820 Inflamed seborrheic keratosis: Secondary | ICD-10-CM

## 2023-06-19 DIAGNOSIS — Z85828 Personal history of other malignant neoplasm of skin: Secondary | ICD-10-CM

## 2023-06-19 DIAGNOSIS — L570 Actinic keratosis: Secondary | ICD-10-CM | POA: Diagnosis not present

## 2023-06-19 DIAGNOSIS — W908XXA Exposure to other nonionizing radiation, initial encounter: Secondary | ICD-10-CM | POA: Diagnosis not present

## 2023-06-19 DIAGNOSIS — L578 Other skin changes due to chronic exposure to nonionizing radiation: Secondary | ICD-10-CM

## 2023-06-19 DIAGNOSIS — Z79899 Other long term (current) drug therapy: Secondary | ICD-10-CM

## 2023-06-19 DIAGNOSIS — Z7189 Other specified counseling: Secondary | ICD-10-CM

## 2023-06-19 DIAGNOSIS — T148XXA Other injury of unspecified body region, initial encounter: Secondary | ICD-10-CM

## 2023-06-19 MED ORDER — MUPIROCIN 2 % EX OINT
1.0000 | TOPICAL_OINTMENT | Freq: Every day | CUTANEOUS | 0 refills | Status: AC
Start: 2023-06-19 — End: ?

## 2023-06-19 NOTE — Patient Instructions (Addendum)

## 2023-06-19 NOTE — Progress Notes (Signed)
Follow-Up Visit   Subjective  Jackson Parks is a 82 y.o. male who presents for the following: 6 month AK follow up at scalp, face and ears.   The patient has spots, moles and lesions to be evaluated, some may be new or changing and the patient may have concern these could be cancer.   The following portions of the chart were reviewed this encounter and updated as appropriate: medications, allergies, medical history  Review of Systems:  No other skin or systemic complaints except as noted in HPI or Assessment and Plan.  Objective  Well appearing patient in no apparent distress; mood and affect are within normal limits.   A focused examination was performed of the following areas: Face, scalp, arms and ears  Relevant exam findings are noted in the Assessment and Plan.  left ear With fissure       Scalp, face, and ears (25) Erythematous thin papules/macules with gritty scale.   arms (6) Erythematous stuck-on, waxy papule or plaque    Assessment & Plan   ACTINIC DAMAGE - chronic, secondary to cumulative UV radiation exposure/sun exposure over time - diffuse scaly erythematous macules with underlying dyspigmentation - Recommend daily broad spectrum sunscreen SPF 30+ to sun-exposed areas, reapply every 2 hours as needed.  - Recommend staying in the shade or wearing long sleeves, sun glasses (UVA+UVB protection) and wide brim hats (4-inch brim around the entire circumference of the hat). - Call for new or changing lesions.   Linear excoriation with crust and impetigo left ear See photo Start mupirocin daily  Recheck next visit. Consider biopsy if persistent.  AK (actinic keratosis) (25) Scalp, face, and ears  Actinic keratoses are precancerous spots that appear secondary to cumulative UV radiation exposure/sun exposure over time. They are chronic with expected duration over 1 year. A portion of actinic keratoses will progress to squamous cell carcinoma of the  skin. It is not possible to reliably predict which spots will progress to skin cancer and so treatment is recommended to prevent development of skin cancer.  Recommend daily broad spectrum sunscreen SPF 30+ to sun-exposed areas, reapply every 2 hours as needed.  Recommend staying in the shade or wearing long sleeves, sun glasses (UVA+UVB protection) and wide brim hats (4-inch brim around the entire circumference of the hat). Call for new or changing lesions.   Destruction of lesion - Scalp, face, and ears (25)  Destruction method: cryotherapy   Informed consent: discussed and consent obtained   Lesion destroyed using liquid nitrogen: Yes   Cryotherapy cycles:  2 Outcome: patient tolerated procedure well with no complications   Post-procedure details: wound care instructions given    Inflamed seborrheic keratosis (6) arms  Symptomatic, irritating, patient would like treated.  Benign-appearing.  Call clinic for new or changing lesions.    Destruction of lesion - arms (6)  Destruction method: cryotherapy   Informed consent: discussed and consent obtained   Lesion destroyed using liquid nitrogen: Yes   Cryotherapy cycles:  2 Outcome: patient tolerated procedure well with no complications   Post-procedure details: wound care instructions given    HISTORY OF BASAL CELL CARCINOMA OF THE SKIN - No evidence of recurrence today - Recommend regular full body skin exams - Recommend daily broad spectrum sunscreen SPF 30+ to sun-exposed areas, reapply every 2 hours as needed.  - Call if any new or changing lesions are noted between office visits   Return in about 2 months (around 08/19/2023) for follow up, with Dr.  Beverlyn Roux, RMA, am acting as scribe for Armida Sans, MD .   Documentation: I have reviewed the above documentation for accuracy and completeness, and I agree with the above.  Armida Sans, MD

## 2023-07-22 DIAGNOSIS — E1122 Type 2 diabetes mellitus with diabetic chronic kidney disease: Secondary | ICD-10-CM | POA: Diagnosis not present

## 2023-07-22 DIAGNOSIS — E7800 Pure hypercholesterolemia, unspecified: Secondary | ICD-10-CM | POA: Diagnosis not present

## 2023-07-22 DIAGNOSIS — N1831 Chronic kidney disease, stage 3a: Secondary | ICD-10-CM | POA: Diagnosis not present

## 2023-07-29 DIAGNOSIS — E785 Hyperlipidemia, unspecified: Secondary | ICD-10-CM | POA: Diagnosis not present

## 2023-07-29 DIAGNOSIS — Z79899 Other long term (current) drug therapy: Secondary | ICD-10-CM | POA: Diagnosis not present

## 2023-07-29 DIAGNOSIS — E119 Type 2 diabetes mellitus without complications: Secondary | ICD-10-CM | POA: Diagnosis not present

## 2023-07-29 DIAGNOSIS — Z0000 Encounter for general adult medical examination without abnormal findings: Secondary | ICD-10-CM | POA: Diagnosis not present

## 2023-07-29 DIAGNOSIS — G4733 Obstructive sleep apnea (adult) (pediatric): Secondary | ICD-10-CM | POA: Diagnosis not present

## 2023-07-31 ENCOUNTER — Ambulatory Visit: Payer: Medicare HMO | Admitting: Dermatology

## 2023-07-31 DIAGNOSIS — L821 Other seborrheic keratosis: Secondary | ICD-10-CM | POA: Diagnosis not present

## 2023-07-31 DIAGNOSIS — C44320 Squamous cell carcinoma of skin of unspecified parts of face: Secondary | ICD-10-CM | POA: Diagnosis not present

## 2023-07-31 DIAGNOSIS — C44629 Squamous cell carcinoma of skin of left upper limb, including shoulder: Secondary | ICD-10-CM

## 2023-07-31 DIAGNOSIS — D492 Neoplasm of unspecified behavior of bone, soft tissue, and skin: Secondary | ICD-10-CM

## 2023-07-31 DIAGNOSIS — L578 Other skin changes due to chronic exposure to nonionizing radiation: Secondary | ICD-10-CM | POA: Diagnosis not present

## 2023-07-31 DIAGNOSIS — D485 Neoplasm of uncertain behavior of skin: Secondary | ICD-10-CM

## 2023-07-31 DIAGNOSIS — L570 Actinic keratosis: Secondary | ICD-10-CM | POA: Diagnosis not present

## 2023-07-31 DIAGNOSIS — Z8589 Personal history of malignant neoplasm of other organs and systems: Secondary | ICD-10-CM

## 2023-07-31 DIAGNOSIS — C44321 Squamous cell carcinoma of skin of nose: Secondary | ICD-10-CM | POA: Diagnosis not present

## 2023-07-31 DIAGNOSIS — W908XXA Exposure to other nonionizing radiation, initial encounter: Secondary | ICD-10-CM

## 2023-07-31 DIAGNOSIS — L820 Inflamed seborrheic keratosis: Secondary | ICD-10-CM | POA: Diagnosis not present

## 2023-07-31 DIAGNOSIS — Z85828 Personal history of other malignant neoplasm of skin: Secondary | ICD-10-CM | POA: Diagnosis not present

## 2023-07-31 NOTE — Patient Instructions (Addendum)

## 2023-07-31 NOTE — Progress Notes (Signed)
Follow-Up Visit   Subjective  Jackson Parks is a 82 y.o. male who presents for the following: recheck left ear, history of fissure, much improved. He has a new spot on his right nose that came up 3 weeks ago, has bled some.   The patient has spots, moles and lesions to be evaluated, some may be new or changing and the patient may have concern these could be cancer.   The following portions of the chart were reviewed this encounter and updated as appropriate: medications, allergies, medical history  Review of Systems:  No other skin or systemic complaints except as noted in HPI or Assessment and Plan.  Objective  Well appearing patient in no apparent distress; mood and affect are within normal limits.  A focused examination was performed of the following areas: Face  Relevant physical exam findings are noted in the Assessment and Plan.  L ear x 2, L cheek x 2, R temple x 2, scalp x 2, arms and hands x 15 (23) Erythematous thin papules/macules with gritty scale. L temple x 4, R temple x 1 (5) Erythematous stuck-on, waxy papule or plaque Right nasal ala 1.5 x 0.7 cm indurated papule Left forearm 1.1 cm indurated papule with crust  Assessment & Plan  ACTINIC DAMAGE - chronic, secondary to cumulative UV radiation exposure/sun exposure over time - diffuse scaly erythematous macules with underlying dyspigmentation - Recommend daily broad spectrum sunscreen SPF 30+ to sun-exposed areas, reapply every 2 hours as needed.  - Recommend staying in the shade or wearing long sleeves, sun glasses (UVA+UVB protection) and wide brim hats (4-inch brim around the entire circumference of the hat). - Call for new or changing lesions.  SEBORRHEIC KERATOSIS - Stuck-on, waxy, tan-brown papules and/or plaques  - Benign-appearing - Discussed benign etiology and prognosis. - Observe - Call for any changes  HISTORY OF BASAL CELL CARCINOMA OF THE SKIN - No evidence of recurrence today -  Recommend regular full body skin exams - Recommend daily broad spectrum sunscreen SPF 30+ to sun-exposed areas, reapply every 2 hours as needed.  - Call if any new or changing lesions are noted between office visits  HISTORY OF SQUAMOUS CELL CARCINOMA OF THE SKIN - No evidence of recurrence today - No lymphadenopathy - Recommend regular full body skin exams - Recommend daily broad spectrum sunscreen SPF 30+ to sun-exposed areas, reapply every 2 hours as needed.  - Call if any new or changing lesions are noted between office visits  AK (ACTINIC KERATOSIS) (23) L ear x 2, L cheek x 2, R temple x 2, scalp x 2, arms and hands x 15 (23) Actinic keratoses are precancerous spots that appear secondary to cumulative UV radiation exposure/sun exposure over time. They are chronic with expected duration over 1 year. A portion of actinic keratoses will progress to squamous cell carcinoma of the skin. It is not possible to reliably predict which spots will progress to skin cancer and so treatment is recommended to prevent development of skin cancer.  Recommend daily broad spectrum sunscreen SPF 30+ to sun-exposed areas, reapply every 2 hours as needed.  Recommend staying in the shade or wearing long sleeves, sun glasses (UVA+UVB protection) and wide brim hats (4-inch brim around the entire circumference of the hat). Call for new or changing lesions. Destruction of lesion - L ear x 2, L cheek x 2, R temple x 2, scalp x 2, arms and hands x 15 (23) Complexity: simple   Destruction method: cryotherapy  Informed consent: discussed and consent obtained   Timeout:  patient name, date of birth, surgical site, and procedure verified Lesion destroyed using liquid nitrogen: Yes   Region frozen until ice ball extended beyond lesion: Yes   Outcome: patient tolerated procedure well with no complications   Post-procedure details: wound care instructions given   INFLAMED SEBORRHEIC KERATOSIS (5) L temple x 4, R  temple x 1 (5) Symptomatic, irritating, patient would like treated. Destruction of lesion - L temple x 4, R temple x 1 (5) Complexity: simple   Destruction method: cryotherapy   Informed consent: discussed and consent obtained   Timeout:  patient name, date of birth, surgical site, and procedure verified Lesion destroyed using liquid nitrogen: Yes   Region frozen until ice ball extended beyond lesion: Yes   Outcome: patient tolerated procedure well with no complications   Post-procedure details: wound care instructions given   NEOPLASM OF UNCERTAIN BEHAVIOR OF SKIN (2) Right nasal ala Epidermal / dermal shaving  Lesion diameter (cm):  1.5 Informed consent: discussed and consent obtained   Timeout: patient name, date of birth, surgical site, and procedure verified   Procedure prep:  Patient was prepped and draped in usual sterile fashion Prep type:  Isopropyl alcohol Anesthesia: the lesion was anesthetized in a standard fashion   Anesthetic:  1% lidocaine w/ epinephrine 1-100,000 buffered w/ 8.4% NaHCO3 Instrument used: flexible razor blade   Hemostasis achieved with: pressure, aluminum chloride and electrodesiccation   Outcome: patient tolerated procedure well   Post-procedure details: sterile dressing applied and wound care instructions given   Dressing type: bandage and petrolatum    Destruction of lesion Complexity: extensive   Destruction method: electrodesiccation and curettage   Informed consent: discussed and consent obtained   Timeout:  patient name, date of birth, surgical site, and procedure verified Anesthesia: the lesion was anesthetized in a standard fashion   Anesthetic:  1% lidocaine w/ epinephrine 1-100,000 buffered w/ 8.4% NaHCO3 Curettage performed in three different directions: Yes   Electrodesiccation performed over the curetted area: Yes   Lesion length (cm):  1.5 Lesion width (cm):  0.7 Margin per side (cm):  0.2 Final wound size (cm):  1.9 Hemostasis  achieved with:  pressure, aluminum chloride and electrodesiccation Outcome: patient tolerated procedure well with no complications   Post-procedure details: wound care instructions given   Specimen 1 - Surgical pathology Differential Diagnosis: r/o BCC Check Margins: No *2 pieces EDC today Left forearm Epidermal / dermal shaving  Lesion diameter (cm):  1.1 Informed consent: discussed and consent obtained   Timeout: patient name, date of birth, surgical site, and procedure verified   Procedure prep:  Patient was prepped and draped in usual sterile fashion Prep type:  Isopropyl alcohol Anesthesia: the lesion was anesthetized in a standard fashion   Anesthetic:  1% lidocaine w/ epinephrine 1-100,000 buffered w/ 8.4% NaHCO3 Instrument used: flexible razor blade   Hemostasis achieved with: pressure, aluminum chloride and electrodesiccation   Outcome: patient tolerated procedure well   Post-procedure details: sterile dressing applied and wound care instructions given   Dressing type: bandage and petrolatum    Destruction of lesion Complexity: extensive   Destruction method: electrodesiccation and curettage   Informed consent: discussed and consent obtained   Timeout:  patient name, date of birth, surgical site, and procedure verified Anesthesia: the lesion was anesthetized in a standard fashion   Anesthetic:  1% lidocaine w/ epinephrine 1-100,000 buffered w/ 8.4% NaHCO3 Curettage performed in three different directions: Yes  Electrodesiccation performed over the curetted area: Yes   Lesion length (cm):  1.1 Lesion width (cm):  1.1 Margin per side (cm):  0.2 Final wound size (cm):  1.5 Hemostasis achieved with:  pressure, aluminum chloride and electrodesiccation Outcome: patient tolerated procedure well with no complications   Post-procedure details: wound care instructions given   Specimen 2 - Surgical pathology Differential Diagnosis: r/o SCC Check Margins: No EDC today Shave  removal and EDC x 2.   Return in about 6 months (around 01/29/2024) for UBSE, Hx BCC, Hx SCC, AKs.  Wendee Beavers, CMA, am acting as scribe for Armida Sans, MD .   Documentation: I have reviewed the above documentation for accuracy and completeness, and I agree with the above.  Armida Sans, MD

## 2023-08-05 LAB — SURGICAL PATHOLOGY

## 2023-08-11 ENCOUNTER — Encounter: Payer: Self-pay | Admitting: Dermatology

## 2023-08-11 ENCOUNTER — Telehealth: Payer: Self-pay

## 2023-08-11 NOTE — Telephone Encounter (Signed)
-----   Message from Armida Sans sent at 08/08/2023  4:06 PM EST ----- FINAL DIAGNOSIS        1. Skin, right nasal ala :       MODERATELY DIFFERENTIATED SQUAMOUS CELL CARCINOMA        2. Skin, left forearm :       WELL DIFFERENTIATED SQUAMOUS CELL CARCINOMA   1&2 - Both are Cancer = SCC Both already treated Recheck next visit

## 2023-08-11 NOTE — Telephone Encounter (Signed)
Advised pt of bx results/sh ?

## 2023-09-08 ENCOUNTER — Other Ambulatory Visit (HOSPITAL_COMMUNITY): Payer: Self-pay

## 2023-12-22 DIAGNOSIS — H1045 Other chronic allergic conjunctivitis: Secondary | ICD-10-CM | POA: Diagnosis not present

## 2023-12-22 DIAGNOSIS — E119 Type 2 diabetes mellitus without complications: Secondary | ICD-10-CM | POA: Diagnosis not present

## 2023-12-22 DIAGNOSIS — H40013 Open angle with borderline findings, low risk, bilateral: Secondary | ICD-10-CM | POA: Diagnosis not present

## 2024-01-02 ENCOUNTER — Other Ambulatory Visit: Payer: Self-pay

## 2024-01-02 ENCOUNTER — Other Ambulatory Visit (HOSPITAL_COMMUNITY): Payer: Self-pay

## 2024-01-06 ENCOUNTER — Other Ambulatory Visit (HOSPITAL_COMMUNITY): Payer: Self-pay

## 2024-01-06 NOTE — Progress Notes (Unsigned)
 Patient ID: Jackson Parks, male   DOB: 03-14-1941, 83 y.o.   MRN: [id number]    Date:  01/09/2024   ID:  Jackson Parks, DOB [birth year], MRN [id number]  PCP:  Nestor Banter, MD  Primary Cardiologist:  Steffan Caniglia [place] MD  Chief Complaint  Patient presents with   Fatigue     History of Present Illness: Jackson Parks is a 83 y.o. male with a history of atrial flutter status post DC cardioversion. He subsequently had an ablation procedure by Dr. Rodolfo Clan in April 2014. He has a history of coronary disease and is status post stenting of the diagonal branch in 2006. Cardiac catheterization in February 2014 showed continued patency of the stent and nonobstructive disease. He also had normal right heart pressures at that time. He does have obstructive sleep apnea and is on CPAP therapy. Followed by Dr Linder Revere.   He is seen with his wife today. She notes a significant decline in his health status this past year. He is much more sluggish and fatigued. He takes frequent breaks in activity and naps. His appetite is poor. He has some bowel incontinence. No chest pain or SOB. He has been on Repatha  for the past year. Labs in Dec were good. She notes more memory loss as well. Weight is down at least 10 lbs. Previously intolerant to statins. He states he feels like he has the flu all the time   Wt Readings from Last 3 Encounters:  01/09/24 262 lb 3.2 oz (118.9 kg)  01/13/23 279 lb (126.6 kg)  01/13/23 137 lb 12.8 oz (62.5 kg)     Past Medical History:  Diagnosis Date   Actinic keratosis 01/19/2007   L clavicle   Atrial flutter (HCC) 10/05/2012   Basal cell carcinoma 01/19/2007   L ant shoulder   BCC (basal cell carcinoma of skin) 05/16/2021   L ant shoulder - excised 05/29/21   CAD (coronary artery disease)    COPD (chronic obstructive pulmonary disease) (HCC)    Diabetes mellitus without complication (HCC)    DM (dermatomyositis)    Hypercholesterolemia    Obesity    Squamous cell carcinoma  of skin 09/19/2008   R post lat shoulder SCCIS   Squamous cell carcinoma of skin 09/19/2008   R med upper back SCCIS   Squamous cell carcinoma of skin 06/05/2021   L mid infra clavicular - SCCIS arising in AK - needs treatment   Squamous cell carcinoma of skin 07/31/2023   L forearm, EDC   Squamous cell carcinoma of skin 07/31/2023   R nasal ala, EDC    Current Outpatient Medications  Medication Sig Dispense Refill   acetaminophen  (TYLENOL ) 500 MG tablet Take 500 mg by mouth every 6 (six) hours as needed.     aspirin  EC 81 MG tablet Take 81 mg by mouth daily.     ezetimibe (ZETIA) 10 MG tablet Take 10 mg by mouth daily.     glimepiride  (AMARYL ) 2 MG tablet Take 2 mg by mouth daily before breakfast.     mupirocin  ointment (BACTROBAN ) 2 % Apply 1 Application topically daily. 22 g 0   NONFORMULARY OR COMPOUNDED ITEM Apply 1-2 g topically 4 (four) times daily. 120 each 2   sitaGLIPtan-metformin  (JANUMET ) 50-1000 MG per tablet Take 1 tablet by mouth 2 (two) times daily with a meal.     traZODone (DESYREL) 100 MG tablet Take 1 tablet by mouth at bedtime.     Current Facility-Administered Medications  Medication Dose Route Frequency  Provider Last Rate Last Admin   betamethasone  acetate-betamethasone  sodium phosphate (CELESTONE ) injection 3 mg  3 mg Intramuscular Once Evans, Brent M, DPM        Allergies:    Allergies  Allergen Reactions   Nsaids Other (See Comments)    Sever GI upset    Statins     Muscle pain   Tolmetin Other (See Comments)    Sever GI upset     Social History:  The patient  reports that he quit smoking about 23 years ago. His smoking use included cigarettes. He started smoking about 62 years ago. He has a 117 pack-year smoking history. He has never used smokeless tobacco. He reports current alcohol use. He reports that he does not use drugs.   Family history:   Family History  Problem Relation Age of Onset   Heart failure Mother 77       deceased   Heart  attack Father 73       deceased   Coronary artery disease Brother 48   Peripheral vascular disease Brother 6    ROS:  Please see the history of present illness.  All other systems reviewed and negative.   PHYSICAL EXAM: VS:  BP 104/64   Pulse 81   Ht 5\' 11"  (1.803 m)   Wt 262 lb 3.2 oz (118.9 kg)   SpO2 96%   BMI 36.57 kg/m  GENERAL:  Well appearing, obese WM in NAD HEENT:  PERRL, EOMI, sclera are clear. Oropharynx is clear. NECK:  No jugular venous distention, carotid upstroke brisk and symmetric, no bruits, no thyromegaly or adenopathy LUNGS:  Clear to auscultation bilaterally CHEST:  Unremarkable HEART:  RRR,  PMI not displaced or sustained,S1 and S2 within normal limits, no S3, no S4: no clicks, no rubs, no murmurs ABD:  Soft, nontender. BS +, no masses or bruits. No hepatomegaly, no splenomegaly EXT:  2 + pulses throughout, no edema, no cyanosis no clubbing SKIN:  Warm and dry.  No rashes NEURO:  Alert and oriented x 3. Cranial nerves II through XII intact. PSYCH:  Cognitively intact     Labs reviewed from 06/06/16: Hgb 13.7. Otherwise CBC normal. Glucose 149. Otherwise CMET is normal. A1c 7.5%. Cholesterol 184, trig-155, HDL 50, LDL 103. TSH normal. Dated 12/12/16: cholesterol 188, triglycerides 306, HDL 52, LDL 75. Hgb 14. Glucose 169. A1c 7.8. Otherwise CMET normal. Dated 08/27/17: cholesterol 168, triglycerides 189, HDL 51, LDL 79. A1c 7.7%. Glucose 186. Other chemistries normal. hgb 13.9.  Dated 08/31/18: Hgb 13.7. glucose 162, other chemistries normal. A1c 7.6%. cholesterol 184, triglycerides 223, HDL 48. LDL 91.  Dated 03/16/19: Hgb 12.8. glucose 149, sodium 135. Other Chemistries normal. A1c 7.2%. cholesterol 161, triglycerides 232, HDL 45, LDL 70.  Dated 04/27/20: cholesterol 197, triglycerides 161, HDL 41, LDL 124. A1c 6.3%. Hgb 13.4. TSH and CMET normal Dated 12/30/22: cholesterol 163, triglycerides 143, HDL 48, LDL 86. A1c 6.2%. Hgb 12.6. CMET normal. TSH normal.   Dated 07/22/23: Hgb 12.9, BUN 30, creatinine 1.4. otherwise CMET normal. A1c 6.1%. TSH normal. Cholesterol 95, triglycerides 97, HDL 53, LDL 23.   EKG Interpretation Date/Time:  Friday Jan 09 2024 09:33:00 EDT Ventricular Rate:  81 PR Interval:  202 QRS Duration:  144 QT Interval:  402 QTC Calculation: 466 R Axis:   -26  Text Interpretation: Sinus rhythm with Premature supraventricular complexes Right bundle branch block When compared with ECG of 02-Oct-2015 10:00, Premature supraventricular complexes are now Present Confirmed by [place], Alexiss Iturralde [phone])  on 01/09/2024 9:52:42 AM    ASSESSMENT AND PLAN:  Problem List Items Addressed This Visit     Atrial flutter (HCC) - Primary   Relevant Orders   EKG 12-Lead (Completed)     1. Atrial flutter- s/p ablation in 2014 without recurrence. NSR today  2. CAD with remote stent of the diagonal. He is asymptomatic. Continue ASA  3. Hypercholesterolemia. Goal LDL <70. Intolerant of statins. On Zetia and Repatha  with excellent results. Wife is concerned that Repatha  may be contributing to his symptoms. Will hold for now until he can be evaluated by PCP.   4. Morbid obesity with OSA. On CPAP.  Followed by Dr Linder Revere  5. Diabetes mellitus: Followed by primary.   6. Constellation of symptoms including marked fatigue, poor appetite, weight loss, memory loss, bowel incontinence. BP is low today. Will hold lisinopril . Hold Repatha . Had labs drawn for PCP today and is seeing him next week. I don't think there is a cardiac cause of these symptoms. Labs will help. Follow up with Dr Norberto Bear.   follow-up in one year.

## 2024-01-08 ENCOUNTER — Other Ambulatory Visit (HOSPITAL_COMMUNITY): Payer: Self-pay

## 2024-01-09 ENCOUNTER — Encounter: Payer: Self-pay | Admitting: Cardiology

## 2024-01-09 ENCOUNTER — Ambulatory Visit: Attending: Cardiology | Admitting: Cardiology

## 2024-01-09 VITALS — BP 104/64 | HR 81 | Ht 71.0 in | Wt 262.2 lb

## 2024-01-09 DIAGNOSIS — I483 Typical atrial flutter: Secondary | ICD-10-CM

## 2024-01-09 DIAGNOSIS — E7800 Pure hypercholesterolemia, unspecified: Secondary | ICD-10-CM | POA: Diagnosis not present

## 2024-01-09 DIAGNOSIS — Z79899 Other long term (current) drug therapy: Secondary | ICD-10-CM | POA: Diagnosis not present

## 2024-01-09 DIAGNOSIS — E1122 Type 2 diabetes mellitus with diabetic chronic kidney disease: Secondary | ICD-10-CM | POA: Diagnosis not present

## 2024-01-09 DIAGNOSIS — N1831 Chronic kidney disease, stage 3a: Secondary | ICD-10-CM | POA: Diagnosis not present

## 2024-01-09 NOTE — Patient Instructions (Signed)
 Medication Instructions:  Hold Lisinopril  Hold Repatha   Continue all other medications *If you need a refill on your cardiac medications before your next appointment, please call your pharmacy*  Lab Work: None ordered  Testing/Procedures: None ordered  Follow-Up: At [facility], you and your health needs are our priority.  As part of our continuing mission to provide you with exceptional heart care, our providers are all part of one team.  This team includes your primary Cardiologist (physician) and Advanced Practice Providers or APPs (Physician Assistants and Nurse Practitioners) who all work together to provide you with the care you need, when you need it.  Your next appointment:  1 year   Call in Feb to schedule May appointment     Provider:  Dr.Jordan   We recommend signing up for the patient portal called "MyChart".  Sign up information is provided on this After Visit Summary.  MyChart is used to connect with patients for Virtual Visits (Telemedicine).  Patients are able to view lab/test results, encounter notes, upcoming appointments, etc.  Non-urgent messages can be sent to your provider as well.   To learn more about what you can do with MyChart, go to [email or web address].

## 2024-01-11 NOTE — Progress Notes (Signed)
 HPI male former smoker followed here for OSA/CPAP and for mild COPD, complicated by DM 2, CAD/stent, a flutter/ablation PFT-12/21/15-minimal obstructive airways disease-FVC 3.82/95%, FEV1 3.23/110%, ratio 0.85, TLC 88%, DLCO 77% NPSG- [facility]- AHI 31.8/ hr,  desaturation to 81%, body weight 307 lbs  -------------------------------------------------------------------------------------   01/13/23-  83 year old male former smoker followed here for OSA/CPAP and for mild COPD, complicated by DM 2, CAD/stent, A Flutter/ablation -no inhalers, trazodone 100,  CPAP 10-20/ [facility] New machine 2016               Download-   100%, AHI 1.7/ hr Body weight today- 279 lbs                              wife here Download reviewed.  He reports doing very well with CPAP with no concerns expressed.  They think his machine is probably about 81 years old and understand they can keep track of that with the homecare company. He denies any problems with his breathing and says overall health has been stable since last here.  01/13/24- 83 year old male former smoker followed here for OSA/CPAP and for mild COPD, complicated by DM 2, CAD/stent, A Flutter/ablation -no inhalers, trazodone 100,  CPAP 10-20/ [facility]    replaced 11/01/21              Download-   SD card blank but he reports routine use and benefit. Body weight today-  260 lbs Blames fatigue on cholesterol drug, now dc'd.   ROS-see HPI   + = positive Constitutional:    +diet loss, night sweats, fevers, chills, fatigue, lassitude. HEENT:    headaches, difficulty swallowing, tooth/dental problems, sore throat,       sneezing, itching, ear ache, +nasal congestion, post nasal drip, snoring CV:    chest pain, orthopnea, PND, swelling in lower extremities, anasarca,                              dizziness, palpitations Resp:   shortness of breath with exertion or at rest.                productive cough,   non-productive cough, coughing up of  blood.              change in color of mucus.  wheezing.   Skin:    rash or lesions. GI:  No-   heartburn, indigestion, abdominal pain, nausea, vomiting, diarrhea,                 change in bowel habits, loss of appetite GU: dysuria, change in color of urine, no urgency or frequency.   flank pain. MS:   +joint pain, stiffness, decreased range of motion, back pain. Neuro-     nothing unusual Psych:  change in mood or affect.  depression or anxiety.   memory loss.  OBJ- Physical Exam General- Alert, Oriented, Affect-appropriate, Distress- none acute, + obese Skin- rash-none, lesions- none, excoriation- none Lymphadenopathy- none Head- atraumatic            Eyes- Gross vision intact, PERRLA, conjunctivae and secretions clear            Ears- Hearing, canals-normal            Nose- no-Septal dev, mucus, polyps, erosion, perforation             Throat- Mallampati IV,  mucosa clear , drainage- none, tonsils- atrophic, has lower teeth, otherwise edentulous Neck- flexible , trachea midline, no stridor , thyroid  nl, carotid no bruit Chest - symmetrical excursion , unlabored           Heart/CV- RRR , + murmur+2/6S , no gallop  , no rub, nl s1 s2                           JVD- none , edema- none, stasis changes- none, varices- none           Lung- + diffuse crackle, wheeze- none, cough- none , dullness-none, rub- none           Chest wall-  Abd-  Br/ Gen/ Rectal- Not done, not indicated Extrem- cyanosis- none, clubbing, none, atrophy- none, strength- nl Neuro- grossly intact to observation

## 2024-01-13 ENCOUNTER — Encounter: Payer: Self-pay | Admitting: Internal Medicine

## 2024-01-13 ENCOUNTER — Ambulatory Visit: Payer: Medicare HMO | Admitting: Internal Medicine

## 2024-01-13 ENCOUNTER — Ambulatory Visit

## 2024-01-13 VITALS — BP 138/67 | HR 75 | Temp 97.9°F | Resp 18 | Ht 71.0 in | Wt 260.0 lb

## 2024-01-13 DIAGNOSIS — G4733 Obstructive sleep apnea (adult) (pediatric): Secondary | ICD-10-CM

## 2024-01-13 DIAGNOSIS — J449 Chronic obstructive pulmonary disease, unspecified: Secondary | ICD-10-CM | POA: Diagnosis not present

## 2024-01-13 DIAGNOSIS — J189 Pneumonia, unspecified organism: Secondary | ICD-10-CM | POA: Diagnosis not present

## 2024-01-13 DIAGNOSIS — E119 Type 2 diabetes mellitus without complications: Secondary | ICD-10-CM | POA: Diagnosis not present

## 2024-01-13 DIAGNOSIS — Z87891 Personal history of nicotine dependence: Secondary | ICD-10-CM | POA: Diagnosis not present

## 2024-01-13 DIAGNOSIS — E785 Hyperlipidemia, unspecified: Secondary | ICD-10-CM | POA: Diagnosis not present

## 2024-01-13 DIAGNOSIS — I10 Essential (primary) hypertension: Secondary | ICD-10-CM | POA: Diagnosis not present

## 2024-01-13 DIAGNOSIS — Z79899 Other long term (current) drug therapy: Secondary | ICD-10-CM | POA: Diagnosis not present

## 2024-01-13 DIAGNOSIS — R918 Other nonspecific abnormal finding of lung field: Secondary | ICD-10-CM | POA: Diagnosis not present

## 2024-01-13 NOTE — Patient Instructions (Signed)
 Order- CXR  dx COPD mixed type  We can continue CPAP auto 10-20  Order- DME [facility]   please replace defective SD card. Continue auto 10-20, mask of choice, heated humidifier, supplies, reinstall AirView.

## 2024-01-21 ENCOUNTER — Ambulatory Visit: Payer: Self-pay | Admitting: Internal Medicine

## 2024-01-29 ENCOUNTER — Ambulatory Visit: Payer: Medicare HMO | Admitting: Dermatology

## 2024-02-15 NOTE — Assessment & Plan Note (Signed)
 Benefits from CPAP with good subjective control [facility] replace defective SD card

## 2024-02-15 NOTE — Assessment & Plan Note (Signed)
 I hear crackle today Plan- CXR

## 2024-02-25 ENCOUNTER — Encounter: Payer: Self-pay | Admitting: Dermatology

## 2024-02-25 ENCOUNTER — Ambulatory Visit: Admitting: Dermatology

## 2024-02-25 DIAGNOSIS — L821 Other seborrheic keratosis: Secondary | ICD-10-CM

## 2024-02-25 DIAGNOSIS — Z8589 Personal history of malignant neoplasm of other organs and systems: Secondary | ICD-10-CM

## 2024-02-25 DIAGNOSIS — W908XXA Exposure to other nonionizing radiation, initial encounter: Secondary | ICD-10-CM | POA: Diagnosis not present

## 2024-02-25 DIAGNOSIS — L820 Inflamed seborrheic keratosis: Secondary | ICD-10-CM | POA: Diagnosis not present

## 2024-02-25 DIAGNOSIS — D1801 Hemangioma of skin and subcutaneous tissue: Secondary | ICD-10-CM

## 2024-02-25 DIAGNOSIS — D492 Neoplasm of unspecified behavior of bone, soft tissue, and skin: Secondary | ICD-10-CM

## 2024-02-25 DIAGNOSIS — D229 Melanocytic nevi, unspecified: Secondary | ICD-10-CM

## 2024-02-25 DIAGNOSIS — Z85828 Personal history of other malignant neoplasm of skin: Secondary | ICD-10-CM

## 2024-02-25 DIAGNOSIS — D0439 Carcinoma in situ of skin of other parts of face: Secondary | ICD-10-CM | POA: Diagnosis not present

## 2024-02-25 DIAGNOSIS — Z1283 Encounter for screening for malignant neoplasm of skin: Secondary | ICD-10-CM

## 2024-02-25 DIAGNOSIS — L570 Actinic keratosis: Secondary | ICD-10-CM | POA: Diagnosis not present

## 2024-02-25 DIAGNOSIS — L814 Other melanin hyperpigmentation: Secondary | ICD-10-CM

## 2024-02-25 DIAGNOSIS — L578 Other skin changes due to chronic exposure to nonionizing radiation: Secondary | ICD-10-CM

## 2024-02-25 DIAGNOSIS — D692 Other nonthrombocytopenic purpura: Secondary | ICD-10-CM

## 2024-02-25 DIAGNOSIS — C4492 Squamous cell carcinoma of skin, unspecified: Secondary | ICD-10-CM

## 2024-02-25 HISTORY — DX: Squamous cell carcinoma of skin, unspecified: C44.92

## 2024-02-25 NOTE — Patient Instructions (Addendum)
 Cryotherapy Aftercare  Wash gently with soap and water everyday.   Apply Vaseline Jelly daily until healed.    Wound Care Instructions  Cleanse wound gently with soap and water once a day then pat dry with clean gauze. Apply a thin coat of Petrolatum (petroleum jelly, "Vaseline") over the wound (unless you have an allergy to this). We recommend that you use a new, sterile tube of Vaseline. Do not pick or remove scabs. Do not remove the yellow or white "healing tissue" from the base of the wound.  Cover the wound with fresh, clean, nonstick gauze and secure with paper tape. You may use Band-Aids in place of gauze and tape if the wound is small enough, but would recommend trimming much of the tape off as there is often too much. Sometimes Band-Aids can irritate the skin.  You should call the office for your biopsy report after 1 week if you have not already been contacted.  If you experience any problems, such as abnormal amounts of bleeding, swelling, significant bruising, significant pain, or evidence of infection, please call the office immediately.  FOR ADULT SURGERY PATIENTS: If you need something for pain relief you may take 1 extra strength Tylenol (acetaminophen) AND 2 Ibuprofen (200mg  each) together every 4 hours as needed for pain. (do not take these if you are allergic to them or if you have a reason you should not take them.) Typically, you may only need pain medication for 1 to 3 days.      Recommend daily broad spectrum sunscreen SPF 30+ to sun-exposed areas, reapply every 2 hours as needed. Call for new or changing lesions.  Staying in the shade or wearing long sleeves, sun glasses (UVA+UVB protection) and wide brim hats (4-inch brim around the entire circumference of the hat) are also recommended for sun protection.    Melanoma ABCDEs  Melanoma is the most dangerous type of skin cancer, and is the leading cause of death from skin disease.  You are more likely to develop  melanoma if you: Have light-colored skin, light-colored eyes, or red or blond hair Spend a lot of time in the sun Tan regularly, either outdoors or in a tanning bed Have had blistering sunburns, especially during childhood Have a close family member who has had a melanoma Have atypical moles or large birthmarks  Early detection of melanoma is key since treatment is typically straightforward and cure rates are extremely high if we catch it early.   The first sign of melanoma is often a change in a mole or a new dark spot.  The ABCDE system is a way of remembering the signs of melanoma.  A for asymmetry:  The two halves do not match. B for border:  The edges of the growth are irregular. C for color:  A mixture of colors are present instead of an even brown color. D for diameter:  Melanomas are usually (but not always) greater than 6mm - the size of a pencil eraser. E for evolution:  The spot keeps changing in size, shape, and color.  Please check your skin once per month between visits. You can use a small mirror in front and a large mirror behind you to keep an eye on the back side or your body.   If you see any new or changing lesions before your next follow-up, please call to schedule a visit.  Please continue daily skin protection including broad spectrum sunscreen SPF 30+ to sun-exposed areas, reapplying every 2 hours  as needed when you're outdoors.   Staying in the shade or wearing long sleeves, sun glasses (UVA+UVB protection) and wide brim hats (4-inch brim around the entire circumference of the hat) are also recommended for sun protection.     Due to recent changes in healthcare laws, you may see results of your pathology and/or laboratory studies on MyChart before the doctors have had a chance to review them. We understand that in some cases there may be results that are confusing or concerning to you. Please understand that not all results are received at the same time and often  the doctors may need to interpret multiple results in order to provide you with the best plan of care or course of treatment. Therefore, we ask that you please give [place] 2 business days to thoroughly review all your results before contacting the office for clarification. Should we see a critical lab result, you will be contacted sooner.   If You Need Anything After Your Visit  If you have any questions or concerns for your doctor, please call our main line at [phone] and press option 4 to reach your doctor's medical assistant. If no one answers, please leave a voicemail as directed and we will return your call as soon as possible. Messages left after 4 pm will be answered the following business day.   You may also send [place] a message via MyChart. We typically respond to MyChart messages within 1-2 business days.  For prescription refills, please ask your pharmacy to contact our office. Our fax number is [phone].  If you have an urgent issue when the clinic is closed that cannot wait until the next business day, you can page your doctor at the number below.    Please note that while we do our best to be available for urgent issues outside of office hours, we are not available 24/7.   If you have an urgent issue and are unable to reach [place], you may choose to seek medical care at your doctor's office, retail clinic, urgent care center, or emergency room.  If you have a medical emergency, please immediately call 911 or go to the emergency department.  Pager Numbers  - Dr. Gwen Pounds: [phone]  - Dr. Roseanne Reno: [phone]  - Dr. Katrinka Blazing: [phone]   In the event of inclement weather, please call our main line at [phone] for an update on the status of any delays or closures.  Dermatology Medication Tips: Please keep the boxes that topical medications come in in order to help keep track of the instructions about where and how to use these. Pharmacies typically print the medication  instructions only on the boxes and not directly on the medication tubes.   If your medication is too expensive, please contact our office at [phone] option 4 or send [place] a message through MyChart.   We are unable to tell what your co-pay for medications will be in advance as this is different depending on your insurance coverage. However, we may be able to find a substitute medication at lower cost or fill out paperwork to get insurance to cover a needed medication.   If a prior authorization is required to get your medication covered by your insurance company, please allow [place] 1-2 business days to complete this process.  Drug prices often vary depending on where the prescription is filled and some pharmacies may offer cheaper prices.  The website www.goodrx.com contains coupons for medications through different pharmacies. The prices here do not account  for what the cost may be with help from insurance (it may be cheaper with your insurance), but the website can give you the price if you did not use any insurance.  - You can print the associated coupon and take it with your prescription to the pharmacy.  - You may also stop by our office during regular business hours and pick up a GoodRx coupon card.  - If you need your prescription sent electronically to a different pharmacy, notify our office through [facility] or by phone at [phone] option 4.     Si Usted Necesita Algo Despus de Su Visita  Tambin puede enviarnos un mensaje a travs de Clinical cytogeneticist. Por lo general respondemos a los mensajes de MyChart en el transcurso de 1 a 2 das hbiles.  Para renovar recetas, por favor pida a su farmacia que se ponga en contacto con nuestra oficina. Annie Sable de fax es [place] [phone].  Si tiene un asunto urgente cuando la clnica est cerrada y que no puede esperar hasta el siguiente da hbil, puede llamar/localizar a su doctor(a) al nmero que aparece a continuacin.   Por  favor, tenga en cuenta que aunque hacemos todo lo posible para estar disponibles para asuntos urgentes fuera del horario de [place], no estamos disponibles las 24 horas del da, los 7 [address]  [address] de la [place].   Si tiene un problema urgente y no puede comunicarse con nosotros, puede optar por buscar atencin mdica  en el consultorio de su doctor(a), en una clnica privada, en un centro de atencin urgente o en una sala de emergencias.  Si tiene Engineer, drilling, por favor llame inmediatamente al 911 o vaya a la sala de emergencias.  Nmeros de bper  - Dr. Gwen Pounds: [phone]  - Dra. Roseanne Reno: [phone]  - Dr. Katrinka Blazing: [phone]   En caso de inclemencias del tiempo, por favor llame a Lacy Duverney principal al [phone] para una actualizacin sobre el [place] de cualquier retraso o cierre.  Consejos para la medicacin en dermatologa: Por favor, guarde las cajas en las que vienen los medicamentos de uso tpico para ayudarle a seguir las instrucciones sobre dnde y cmo usarlos. Las farmacias generalmente imprimen las instrucciones del medicamento slo en las cajas y no directamente en los tubos del [place].   Si su medicamento es muy caro, por favor, pngase en contacto con Rolm Gala llamando al [phone] y presione la opcin 4 o envenos un mensaje a travs de Clinical cytogeneticist.   No podemos decirle cul ser su copago por los medicamentos por adelantado ya que esto es diferente dependiendo de la cobertura de su seguro. Sin embargo, es posible que podamos encontrar un medicamento sustituto a Audiological scientist un formulario para que el seguro cubra el medicamento que se considera necesario.   Si se requiere una autorizacin previa para que su compaa de seguros [place] su medicamento, por favor permtanos de 1 a 2 das hbiles para completar [address]  Los precios de los medicamentos varan con frecuencia dependiendo del Environmental consultant de dnde se surte la receta y alguna farmacias  pueden ofrecer precios ms baratos.  El sitio web www.goodrx.com tiene cupones para medicamentos de Health and safety inspector. Los precios aqu no tienen en cuenta lo que podra costar con la ayuda del seguro (puede ser ms barato con su seguro), pero el sitio web puede darle el precio si no utiliz Tourist information centre manager.  - Puede imprimir el cupn correspondiente y llevarlo con su receta a la farmacia.  -  Tambin puede pasar por nuestra oficina durante el horario de atencin regular y Education officer, museum una tarjeta de cupones de GoodRx.  - Si necesita que su receta se enve electrnicamente a una farmacia diferente, informe a nuestra oficina a travs de MyChart de [place] o por telfono llamando al [phone] y presione la opcin 4.

## 2024-02-25 NOTE — Progress Notes (Signed)
 Follow-Up Visit   Subjective  Jackson Parks is a 83 y.o. male who presents for the following: Skin Cancer Screening and Upper Body Skin Exam. Hx of SCCs. Hx of BCCs. Hx of AKs. Recheck AKs Tx at last visit. L ear, face, scalp, arms, hands.  Lesion under chin has been treated with LN2. Will not heal. Irritated. Shaves over area. Has been treated with ointment ~2 years. Improves but never resolves completely.   The patient presents for Upper Body Skin Exam (UBSE) for skin cancer screening and mole check. The patient has spots, moles and lesions to be evaluated, some may be new or changing and the patient may have concern these could be cancer.  The following portions of the chart were reviewed this encounter and updated as appropriate: medications, allergies, medical history  Review of Systems:  No other skin or systemic complaints except as noted in HPI or Assessment and Plan.  Objective  Well appearing patient in no apparent distress; mood and affect are within normal limits.  All skin waist up examined. Relevant physical exam findings are noted in the Assessment and Plan.  Mid Submental Area 2.5 cm crusted hypopigmented plaque  Left Temple x1, R shoulder x1 (2) Erythematous keratotic or waxy stuck-on papule or plaque. scalp, ears, arms, face, hands x15 (15) Erythematous thin papules/macules with gritty scale.   Assessment & Plan   NEOPLASM OF SKIN Mid Submental Area Skin / nail biopsy Type of biopsy: tangential   Informed consent: discussed and consent obtained   Timeout: patient name, date of birth, surgical site, and procedure verified   Procedure prep:  Patient was prepped and draped in usual sterile fashion Prep type:  Isopropyl alcohol Anesthesia: the lesion was anesthetized in a standard fashion   Anesthetic:  1% lidocaine  w/ epinephrine 1-100,000 buffered w/ 8.4% NaHCO3 Instrument used: flexible razor blade   Hemostasis achieved with: pressure, aluminum chloride  and electrodesiccation   Outcome: patient tolerated procedure well   Post-procedure details: sterile dressing applied and wound care instructions given   Dressing type: bandage and petrolatum    Specimen 1 - Surgical pathology Differential Diagnosis: R/O BCC vs SCC  Check Margins: No INFLAMED SEBORRHEIC KERATOSIS (2) Left Temple x1, R shoulder x1 (2) Symptomatic, irritating, patient would like treated. Destruction of lesion - Left Temple x1, R shoulder x1 (2) Complexity: simple   Destruction method: cryotherapy   Informed consent: discussed and consent obtained   Timeout:  patient name, date of birth, surgical site, and procedure verified Lesion destroyed using liquid nitrogen: Yes   Region frozen until ice ball extended beyond lesion: Yes   Outcome: patient tolerated procedure well with no complications   Post-procedure details: wound care instructions given   Additional details:  Prior to procedure, discussed risks of blister formation, small wound, skin dyspigmentation, or rare scar following cryotherapy. Recommend Vaseline ointment to treated areas while healing.   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 They are chronic with expected duration over 1 year. A portion of actinic keratoses will progress to squamous cell carcinoma of the skin. It is not possible to reliably predict which spots will progress to skin cancer and so treatment is recommended to prevent development of skin cancer.  Recommend daily broad spectrum sunscreen SPF 30+ to sun-exposed areas, reapply every 2 hours as needed.  Recommend staying in the shade or wearing long sleeves,  sun glasses (UVA+UVB protection) and wide brim hats (4-inch brim around the entire circumference of the hat). Call for new or changing lesions. Destruction of lesion - scalp, ears, arms,  face, hands x15 (15) Complexity: simple   Destruction method: cryotherapy   Informed consent: discussed and consent obtained   Timeout:  patient name, date of birth, surgical site, and procedure verified Lesion destroyed using liquid nitrogen: Yes   Region frozen until ice ball extended beyond lesion: Yes   Outcome: patient tolerated procedure well with no complications   Post-procedure details: wound care instructions given   Additional details:  Prior to procedure, discussed risks of blister formation, small wound, skin dyspigmentation, or rare scar following cryotherapy. Recommend Vaseline ointment to treated areas while healing.   Skin cancer screening performed today.  HISTORY OF BASAL CELL CARCINOMA OF THE SKIN - No evidence of recurrence today - Recommend regular full body skin exams - Recommend daily broad spectrum sunscreen SPF 30+ to sun-exposed areas, reapply every 2 hours as needed.  - Call if any new or changing lesions are noted between office visits  HISTORY OF SQUAMOUS CELL CARCINOMA OF THE SKIN - No evidence of recurrence today - No lymphadenopathy - Recommend regular full body skin exams - Recommend daily broad spectrum sunscreen SPF 30+ to sun-exposed areas, reapply every 2 hours as needed.  - Call if any new or changing lesions are noted between office visits  Actinic Damage - Chronic condition, secondary to cumulative UV/sun exposure - diffuse scaly erythematous macules with underlying dyspigmentation - Recommend daily broad spectrum sunscreen SPF 30+ to sun-exposed areas, reapply every 2 hours as needed.  - Staying in the shade or wearing long sleeves, sun glasses (UVA+UVB protection) and wide brim hats (4-inch brim around the entire circumference of the hat) are also recommended for sun protection.  - Call for new or changing lesions.  Lentigines, Seborrheic Keratoses, Hemangiomas - Benign normal skin lesions - Benign-appearing - Call for any  changes  Melanocytic Nevi - Tan-brown and/or pink-flesh-colored symmetric macules and papules - Benign appearing on exam today - Observation - Call clinic for new or changing moles - Recommend daily use of broad spectrum spf 30+ sunscreen to sun-exposed areas.   Purpura - Chronic; persistent and recurrent.  Treatable, but not curable. - Violaceous macules and patches - Benign - Related to trauma, age, sun damage and/or use of blood thinners, chronic use of topical and/or oral steroids - Observe - Can use OTC arnica containing moisturizer such as Dermend Bruise Formula if desired - Call for worsening or other concerns   Return in about 8 months (around 10/25/2024) for AK Follow Up, Biopsy Follow Up, TBSE, HxSCC/BCC.  I, Jill Parcell, CMA, am acting as scribe for Alm Rhyme, MD.   Documentation: I have reviewed the above documentation for accuracy and completeness, and I agree with the above.  Alm Rhyme, MD

## 2024-02-27 LAB — SURGICAL PATHOLOGY

## 2024-03-01 ENCOUNTER — Encounter: Payer: Self-pay | Admitting: Dermatology

## 2024-03-01 ENCOUNTER — Ambulatory Visit: Payer: Self-pay | Admitting: Dermatology

## 2024-03-01 NOTE — Telephone Encounter (Signed)
-----   Message from Alm Rhyme sent at 03/01/2024  5:15 PM EDT ----- FINAL DIAGNOSIS        1. Skin, mid submental area :       SQUAMOUS CELL CARCINOMA IN SITU, HYPERTROPHIC   Cancer = SCCis Superficial Schedule for treatment (EDC) in about 8 weeks ----- Message ----- From: Interface, Lab In [place] Sent: 02/27/2024   5:25 PM EDT To: Alm JAYSON Rhyme, MD

## 2024-03-01 NOTE — Telephone Encounter (Signed)
Patient informed of pathology results. Appointment scheduled.

## 2024-04-19 DIAGNOSIS — E1122 Type 2 diabetes mellitus with diabetic chronic kidney disease: Secondary | ICD-10-CM | POA: Diagnosis not present

## 2024-04-19 DIAGNOSIS — N1831 Chronic kidney disease, stage 3a: Secondary | ICD-10-CM | POA: Diagnosis not present

## 2024-04-19 DIAGNOSIS — E7800 Pure hypercholesterolemia, unspecified: Secondary | ICD-10-CM | POA: Diagnosis not present

## 2024-04-22 DIAGNOSIS — J449 Chronic obstructive pulmonary disease, unspecified: Secondary | ICD-10-CM | POA: Diagnosis not present

## 2024-04-22 DIAGNOSIS — E785 Hyperlipidemia, unspecified: Secondary | ICD-10-CM | POA: Diagnosis not present

## 2024-04-22 DIAGNOSIS — G4700 Insomnia, unspecified: Secondary | ICD-10-CM | POA: Diagnosis not present

## 2024-04-22 DIAGNOSIS — Z79899 Other long term (current) drug therapy: Secondary | ICD-10-CM | POA: Diagnosis not present

## 2024-04-22 DIAGNOSIS — E119 Type 2 diabetes mellitus without complications: Secondary | ICD-10-CM | POA: Diagnosis not present

## 2024-05-13 ENCOUNTER — Ambulatory Visit: Admitting: Dermatology

## 2024-05-13 ENCOUNTER — Encounter: Payer: Self-pay | Admitting: Dermatology

## 2024-05-13 DIAGNOSIS — D099 Carcinoma in situ, unspecified: Secondary | ICD-10-CM

## 2024-05-13 DIAGNOSIS — D044 Carcinoma in situ of skin of scalp and neck: Secondary | ICD-10-CM | POA: Diagnosis not present

## 2024-05-13 DIAGNOSIS — Z7189 Other specified counseling: Secondary | ICD-10-CM

## 2024-05-13 DIAGNOSIS — Z79899 Other long term (current) drug therapy: Secondary | ICD-10-CM

## 2024-05-13 DIAGNOSIS — L820 Inflamed seborrheic keratosis: Secondary | ICD-10-CM | POA: Diagnosis not present

## 2024-05-13 DIAGNOSIS — L578 Other skin changes due to chronic exposure to nonionizing radiation: Secondary | ICD-10-CM

## 2024-05-13 DIAGNOSIS — Z5111 Encounter for antineoplastic chemotherapy: Secondary | ICD-10-CM

## 2024-05-13 DIAGNOSIS — L570 Actinic keratosis: Secondary | ICD-10-CM

## 2024-05-13 DIAGNOSIS — W908XXA Exposure to other nonionizing radiation, initial encounter: Secondary | ICD-10-CM

## 2024-05-13 NOTE — Progress Notes (Unsigned)
 Follow-Up Visit   Subjective  Jackson Parks is a 83 y.o. male who presents for the following: patient here for treatment of a biopsy proven  SQUAMOUS CELL CARCINOMA IN SITU, HYPERTROPHIC- mid submental area   The following portions of the chart were reviewed this encounter and updated as appropriate: medications, allergies, medical history  Review of Systems:  No other skin or systemic complaints except as noted in HPI or Assessment and Plan.  Objective  Well appearing patient in no apparent distress; mood and affect are within normal limits.  A focused examination was performed of the following areas: Mid submental area , face   Relevant exam findings are noted in the Assessment and Plan.  mid submental area Healing biopsy site  face x 9 (9) Erythematous thin papules/macules with gritty scale.  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 Anesthesia: the lesion was anesthetized in a standard fashion   Anesthetic:  1% lidocaine  w/ epinephrine 1-100,000 local infiltration Lesion destroyed using liquid nitrogen: Yes   Region frozen until ice ball extended beyond lesion: Yes   Hemostasis achieved with:  pressure, aluminum chloride and electrodesiccation Outcome: patient tolerated procedure well with no complications   Post-procedure details: wound care instructions given   Post-procedure details comment:  Ointment and small bandage applied  See previous pathology  Collected: 02/25/2024  Accession: [id number]  Discussed treatment options Treat with ED&C  Or   2. Treat with  cryotherapy today followed by Topical Chemo with  5FU/ calcipotriene cream twice daily for 7 days starting July 12, 2024  Patient prefers cryotherapy and cream treatment   Start treatment December 1  - Start 5-fluorouracil/calcipotriene cream twice a day for 7 days to affected areas including under chin. Prescription sent to [facility]. Patient advised they will receive an email to purchase the medication online and have it sent to their home. Patient provided with handout reviewing treatment course and side effects and advised to call or message us  on MyChart with any concerns.  Reviewed course of treatment and expected reaction.  Patient advised to expect inflammation and crusting and advised that erosions are possible.  Patient advised to be diligent with sun protection during and after treatment. Counseled to keep medication out of reach of children and pets.  ACTINIC KERATOSIS (9) face x 9 (9) Actinic keratoses are precancerous spots that appear secondary to cumulative UV radiation exposure/sun exposure over time. They are chronic with expected duration over 1 year. A portion of actinic keratoses will progress to squamous cell carcinoma of the skin. It is not possible to reliably predict which spots will progress to skin cancer and so treatment is recommended to prevent development of skin cancer.  Recommend daily broad spectrum sunscreen SPF 30+ to sun-exposed areas, reapply every 2 hours as needed.  Recommend staying in the shade or wearing long sleeves, sun glasses (UVA+UVB protection) and wide brim hats (4-inch brim around the entire circumference of the hat). Call for new or changing lesions. Destruction of lesion - face x 9 (9) Complexity: simple   Destruction method: cryotherapy   Informed consent: discussed and consent obtained  Timeout:  patient name, date of birth, surgical site, and procedure verified Lesion destroyed using liquid nitrogen: Yes    Region frozen until ice ball extended beyond lesion: Yes   Outcome: patient tolerated procedure well with no complications   Post-procedure details: wound care instructions given    INFLAMED SEBORRHEIC KERATOSIS (2) face x 2 (2) Symptomatic, irritating, patient would like treated. Destruction of lesion - face x 2 (2) Complexity: simple   Destruction method: cryotherapy   Informed consent: discussed and consent obtained   Timeout:  patient name, date of birth, surgical site, and procedure verified Lesion destroyed using liquid nitrogen: Yes   Region frozen until ice ball extended beyond lesion: Yes   Outcome: patient tolerated procedure well with no complications   Post-procedure details: wound care instructions given    CHEMOTHERAPY MANAGEMENT, ENCOUNTER FOR   COUNSELING AND COORDINATION OF CARE   MEDICATION MANAGEMENT   ACTINIC SKIN DAMAGE    Return for scheduled appt 10/26/2024.  IFay Kirks, CMA, am acting as scribe for Alm Rhyme, MD .   Documentation: I have reviewed the above documentation for accuracy and completeness, and I agree with the above.  Alm Rhyme, MD

## 2024-05-13 NOTE — Patient Instructions (Addendum)
 Recommend using mupirocin  2 % ointment - apply to wounds twice daily and cover until healed.    Cryotherapy Aftercare  Wash gently with soap and water everyday.   Apply Vaseline and Band-Aid daily until healed.   Start December 1  - Start 5-fluorouracil/calcipotriene cream twice a day for 7 days to affected areas including under chin . Prescription sent to [facility]. Patient advised they will receive an email to purchase the medication online and have it sent to their home. Patient provided with handout reviewing treatment course and side effects and advised to call or message us  on MyChart with any concerns.  Reviewed course of treatment and expected reaction.  Patient advised to expect inflammation and crusting and advised that erosions are possible.  Patient advised to be diligent with sun protection during and after treatment. Counseled to keep medication out of reach of children and pets.  Instructions for [facility]  One or more of your medications was sent to the [facility] order [facility]. You will receive an email from them and can purchase the medicine through that link. It will then be mailed to your home at the address you confirmed. If for any reason you do not receive an email from them, please check your spam folder. If you still do not find the email, please let us  know. [facility] phone number is [phone].    5-Fluorouracil/Calcipotriene Patient Education   Actinic keratoses are the dry, red scaly spots on the skin caused by sun damage. A portion of these spots can turn into skin cancer with time, and treating them can help prevent development of skin cancer.   Treatment of these spots requires removal of the defective skin cells. There are various ways to remove actinic keratoses, including freezing with liquid nitrogen, treatment with creams, or treatment with a blue light procedure in the office.    5-fluorouracil cream is a topical cream used to treat actinic keratoses. It works by interfering with the growth of abnormal fast-growing skin cells, such as actinic keratoses. These cells peel off and are replaced by healthy ones.   5-fluorouracil/calcipotriene is a combination of the 5-fluorouracil cream with a vitamin D analog cream called calcipotriene. The calcipotriene alone does not treat actinic keratoses. However, when it is combined with 5-fluorouracil, it helps the 5-fluorouracil treat the actinic keratoses much faster so that the same results can be achieved with a much shorter treatment time.  INSTRUCTIONS FOR 5-FLUOROURACIL/CALCIPOTRIENE CREAM:   5-fluorouracil/calcipotriene cream typically only needs to be used for 4-7 days. A thin layer should be applied twice a day to the treatment areas recommended by your physician.   If your physician prescribed you separate tubes of 5-fluourouracil and calcipotriene, apply a thin layer of 5-fluorouracil followed by a thin layer of calcipotriene.   Avoid contact with your eyes, nostrils, and mouth. Do not use 5-fluorouracil/calcipotriene cream on infected or open wounds.   You will develop redness, irritation and some crusting at areas where you have pre-cancer damage/actinic keratoses. IF YOU DEVELOP PAIN, BLEEDING, OR SIGNIFICANT CRUSTING, STOP THE TREATMENT EARLY - you have already gotten a good response and the actinic keratoses should clear up well.  Wash your hands after applying 5-fluorouracil 5% cream on your skin.   A moisturizer or sunscreen with a minimum SPF 30 should be applied each morning.   Once you have finished the treatment, you can apply a thin layer of Vaseline twice a day to irritated areas to  soothe and calm the areas more quickly. If you experience significant discomfort, contact your physician.  For some patients it is necessary to repeat the treatment for best results.  SIDE EFFECTS: When using  5-fluorouracil/calcipotriene cream, you may have mild irritation, such as redness, dryness, swelling, or a mild burning sensation. This usually resolves within 2 weeks. The more actinic keratoses you have, the more redness and inflammation you can expect during treatment. Eye irritation has been reported rarely. If this occurs, please let us  know.  If you have any trouble using this cream, please call the office. If you have any other questions about this information, please do not hesitate to ask me before you leave the office.   Actinic keratoses are precancerous spots that appear secondary to cumulative UV radiation exposure/sun exposure over time. They are chronic with expected duration over 1 year. A portion of actinic keratoses will progress to squamous cell carcinoma of the skin. It is not possible to reliably predict which spots will progress to skin cancer and so treatment is recommended to prevent development of skin cancer.  Recommend daily broad spectrum sunscreen SPF 30+ to sun-exposed areas, reapply every 2 hours as needed.  Recommend staying in the shade or wearing long sleeves, sun glasses (UVA+UVB protection) and wide brim hats (4-inch brim around the entire circumference of the hat). Call for new or changing lesions.        Due to recent changes in healthcare laws, you may see results of your pathology and/or laboratory studies on MyChart before the doctors have had a chance to review them. We understand that in some cases there may be results that are confusing or concerning to you. Please understand that not all results are received at the same time and often the doctors may need to interpret multiple results in order to provide you with the best plan of care or course of treatment. Therefore, we ask that you please give us  2 business days to thoroughly review all your results before contacting the office for clarification. Should we see a critical lab result, you will be contacted  sooner.   If You Need Anything After Your Visit  If you have any questions or concerns for your doctor, please call our main line at [phone] and press option 4 to reach your doctor's medical assistant. If no one answers, please leave a voicemail as directed and we will return your call as soon as possible. Messages left after 4 pm will be answered the following business day.   You may also send us  a message via MyChart. We typically respond to MyChart messages within 1-2 business days.  For prescription refills, please ask your pharmacy to contact our office. Our fax number is [phone].  If you have an urgent issue when the clinic is closed that cannot wait until the next business day, you can page your doctor at the number below.    Please note that while we do our best to be available for urgent issues outside of office hours, we are not available 24/7.   If you have an urgent issue and are unable to reach us , you may choose to seek medical care at your doctor's office, retail clinic, urgent care center, or emergency room.  If you have a medical emergency, please immediately call 911 or go to the emergency department.  Pager Numbers  - Dr. Hester: [phone]  - Dr. Jackquline: [phone]  - Dr. Claudene: [phone]   - Dr. Raymund: [phone]  In the event of inclement weather, please call our main line at [phone] for an update on the status of any delays or closures.  Dermatology Medication Tips: Please keep the boxes that topical medications come in in order to help keep track of the instructions about where and how to use these. Pharmacies typically print the medication instructions only on the boxes and not directly on the medication tubes.   If your medication is too expensive, please contact our office at [phone] option 4 or send us  a message through MyChart.   We are unable to tell what your co-pay for medications will be in advance as this is  different depending on your insurance coverage. However, we may be able to find a substitute medication at lower cost or fill out paperwork to get insurance to cover a needed medication.   If a prior authorization is required to get your medication covered by your insurance company, please allow us  1-2 business days to complete this process.  Drug prices often vary depending on where the prescription is filled and some pharmacies may offer cheaper prices.  The website www.goodrx.com contains coupons for medications through different pharmacies. The prices here do not account for what the cost may be with help from insurance (it may be cheaper with your insurance), but the website can give you the price if you did not use any insurance.  - You can print the associated coupon and take it with your prescription to the pharmacy.  - You may also stop by our office during regular business hours and pick up a GoodRx coupon card.  - If you need your prescription sent electronically to a different pharmacy, notify our office through [facility] or by phone at [phone] option 4.     Si Usted Necesita Algo Despus de Su Visita  Tambin puede enviarnos un mensaje a travs de Clinical cytogeneticist. Por lo general respondemos a los mensajes de MyChart en el transcurso de 1 a 2 das hbiles.  Para renovar recetas, por favor pida a su farmacia que se ponga en contacto con nuestra oficina. Randi lakes de fax es [place] [phone].  Si tiene un asunto urgente cuando la clnica est cerrada y que no puede esperar hasta el siguiente da hbil, puede llamar/localizar a su doctor(a) al nmero que aparece a continuacin.   Por favor, tenga en cuenta que aunque hacemos todo lo posible para estar disponibles para asuntos urgentes fuera del horario de [place], no estamos disponibles las 24 horas del da, los 7 [address]  [address] de la [place].   Si tiene un problema urgente y no puede comunicarse con nosotros, puede optar por buscar  atencin mdica  en el consultorio de su doctor(a), en una clnica privada, en un centro de atencin urgente o en una sala de emergencias.  Si tiene Engineer, drilling, por favor llame inmediatamente al 911 o vaya a la sala de emergencias.  Nmeros de bper  - Dr. Hester: [phone]  - Dra. Jackquline: [phone]  - Dr. Claudene: [phone]  - Dra. Kitts: [phone]  En caso de inclemencias del [place], por favor llame a nuestra lnea principal al [phone] para una actualizacin sobre el estado de cualquier retraso o cierre.  Consejos para la medicacin en dermatologa: Por favor, guarde las cajas en las que vienen los medicamentos de uso tpico para ayudarle a seguir las instrucciones sobre dnde y cmo usarlos. Las farmacias generalmente imprimen las instrucciones del medicamento slo en las cajas y no directamente en los  tubos del medicamento.   Si su medicamento es muy caro, por favor, pngase en contacto con landry rieger llamando al [phone] y presione la opcin 4 o envenos un mensaje a travs de Clinical cytogeneticist.   No podemos decirle cul ser su copago por los medicamentos por adelantado ya que esto es diferente dependiendo de la cobertura de su seguro. Sin embargo, es posible que podamos encontrar un medicamento sustituto a Audiological scientist un formulario para que el seguro cubra el medicamento que se considera necesario.   Si se requiere una autorizacin previa para que su compaa de seguros malta su medicamento, por favor permtanos de 1 a 2 das hbiles para completar este proceso.  Los precios de los medicamentos varan con frecuencia dependiendo del Environmental consultant de dnde se surte la receta y alguna farmacias pueden ofrecer precios ms baratos.  El sitio web www.goodrx.com tiene cupones para medicamentos de Health and safety inspector. Los precios aqu no tienen en cuenta lo que podra costar con la ayuda del seguro (puede ser ms barato con su seguro), pero el sitio web puede  darle el precio si no utiliz Tourist information centre manager.  - Puede imprimir el cupn correspondiente y llevarlo con su receta a la farmacia.  - Tambin puede pasar por nuestra oficina durante el horario de atencin regular y Education officer, museum una tarjeta de cupones de GoodRx.  - Si necesita que su receta se enve electrnicamente a una farmacia diferente, informe a nuestra oficina a travs de MyChart de [facility] o por telfono llamando al [phone] y presione la opcin 4.

## 2024-05-18 ENCOUNTER — Telehealth: Payer: Self-pay

## 2024-05-18 NOTE — Telephone Encounter (Signed)
 I called pt and  he  understands to start 5FU / Calcip Dec 1 bid for 7 days, this information was also given to patient at check out in his after visit summary

## 2024-10-26 ENCOUNTER — Ambulatory Visit: Admitting: Dermatology
